# Patient Record
Sex: Female | Born: 1935 | Race: White | Hispanic: No | Marital: Married
Health system: Southern US, Community
[De-identification: ages and names within clinical notes are randomized; demographics above are authoritative.]

## PROBLEM LIST (undated history)

## (undated) DIAGNOSIS — E079 Disorder of thyroid, unspecified: Secondary | ICD-10-CM

## (undated) DIAGNOSIS — I639 Cerebral infarction, unspecified: Secondary | ICD-10-CM

## (undated) DIAGNOSIS — C9110 Chronic lymphocytic leukemia of B-cell type not having achieved remission: Secondary | ICD-10-CM

## (undated) DIAGNOSIS — F329 Major depressive disorder, single episode, unspecified: Secondary | ICD-10-CM

## (undated) DIAGNOSIS — M1990 Unspecified osteoarthritis, unspecified site: Secondary | ICD-10-CM

## (undated) DIAGNOSIS — F32A Depression, unspecified: Secondary | ICD-10-CM

## (undated) DIAGNOSIS — I493 Ventricular premature depolarization: Secondary | ICD-10-CM

## (undated) DIAGNOSIS — C9590 Leukemia, unspecified not having achieved remission: Secondary | ICD-10-CM

## (undated) HISTORY — PX: HERNIA REPAIR: SHX51

## (undated) HISTORY — PX: APPENDECTOMY: SHX54

## (undated) HISTORY — DX: Chronic lymphocytic leukemia of B-cell type not having achieved remission: C91.10

## (undated) HISTORY — PX: ABDOMINAL HYSTERECTOMY: SHX81

## (undated) HISTORY — DX: Unspecified osteoarthritis, unspecified site: M19.90

## (undated) HISTORY — DX: Ventricular premature depolarization: I49.3

---

## 1997-11-13 ENCOUNTER — Ambulatory Visit (HOSPITAL_COMMUNITY): Admission: RE | Admit: 1997-11-13 | Discharge: 1997-11-13 | Payer: Self-pay | Admitting: Orthopaedic Surgery

## 1997-11-24 ENCOUNTER — Ambulatory Visit (HOSPITAL_COMMUNITY): Admission: RE | Admit: 1997-11-24 | Discharge: 1997-11-24 | Payer: Self-pay | Admitting: Orthopaedic Surgery

## 1997-12-29 ENCOUNTER — Inpatient Hospital Stay (HOSPITAL_COMMUNITY): Admission: EM | Admit: 1997-12-29 | Discharge: 1998-01-07 | Payer: Self-pay | Admitting: Emergency Medicine

## 1998-04-20 ENCOUNTER — Ambulatory Visit (HOSPITAL_COMMUNITY): Admission: RE | Admit: 1998-04-20 | Discharge: 1998-04-20 | Payer: Self-pay | Admitting: Gastroenterology

## 1998-06-17 ENCOUNTER — Other Ambulatory Visit: Admission: RE | Admit: 1998-06-17 | Discharge: 1998-06-17 | Payer: Self-pay | Admitting: Internal Medicine

## 1998-06-22 ENCOUNTER — Ambulatory Visit (HOSPITAL_COMMUNITY): Admission: RE | Admit: 1998-06-22 | Discharge: 1998-06-22 | Payer: Self-pay | Admitting: Internal Medicine

## 1998-06-22 ENCOUNTER — Encounter (HOSPITAL_BASED_OUTPATIENT_CLINIC_OR_DEPARTMENT_OTHER): Payer: Self-pay | Admitting: Internal Medicine

## 1999-10-19 ENCOUNTER — Inpatient Hospital Stay (HOSPITAL_COMMUNITY): Admission: RE | Admit: 1999-10-19 | Discharge: 1999-10-23 | Payer: Self-pay | Admitting: Orthopaedic Surgery

## 1999-10-23 ENCOUNTER — Inpatient Hospital Stay (HOSPITAL_COMMUNITY)
Admission: RE | Admit: 1999-10-23 | Discharge: 1999-10-27 | Payer: Self-pay | Admitting: Physical Medicine & Rehabilitation

## 2000-05-16 ENCOUNTER — Encounter: Payer: Self-pay | Admitting: Emergency Medicine

## 2000-05-16 ENCOUNTER — Emergency Department (HOSPITAL_COMMUNITY): Admission: EM | Admit: 2000-05-16 | Discharge: 2000-05-16 | Payer: Self-pay | Admitting: Emergency Medicine

## 2000-07-27 ENCOUNTER — Emergency Department (HOSPITAL_COMMUNITY): Admission: EM | Admit: 2000-07-27 | Discharge: 2000-07-27 | Payer: Self-pay | Admitting: Emergency Medicine

## 2000-11-05 ENCOUNTER — Encounter: Payer: Self-pay | Admitting: Emergency Medicine

## 2000-11-05 ENCOUNTER — Emergency Department (HOSPITAL_COMMUNITY): Admission: EM | Admit: 2000-11-05 | Discharge: 2000-11-05 | Payer: Self-pay | Admitting: Emergency Medicine

## 2003-11-24 ENCOUNTER — Other Ambulatory Visit: Admission: RE | Admit: 2003-11-24 | Discharge: 2003-11-24 | Payer: Self-pay | Admitting: Internal Medicine

## 2004-01-01 ENCOUNTER — Ambulatory Visit (HOSPITAL_COMMUNITY): Admission: RE | Admit: 2004-01-01 | Discharge: 2004-01-01 | Payer: Self-pay | Admitting: Oncology

## 2004-04-21 ENCOUNTER — Ambulatory Visit (HOSPITAL_COMMUNITY): Admission: RE | Admit: 2004-04-21 | Discharge: 2004-04-21 | Payer: Self-pay | Admitting: Gastroenterology

## 2004-04-21 ENCOUNTER — Encounter (INDEPENDENT_AMBULATORY_CARE_PROVIDER_SITE_OTHER): Payer: Self-pay | Admitting: Specialist

## 2004-09-20 ENCOUNTER — Emergency Department (HOSPITAL_COMMUNITY): Admission: EM | Admit: 2004-09-20 | Discharge: 2004-09-20 | Payer: Self-pay | Admitting: *Deleted

## 2005-01-18 ENCOUNTER — Ambulatory Visit: Payer: Self-pay | Admitting: Oncology

## 2005-02-03 ENCOUNTER — Ambulatory Visit (HOSPITAL_COMMUNITY): Admission: RE | Admit: 2005-02-03 | Discharge: 2005-02-03 | Payer: Self-pay | Admitting: Oncology

## 2005-07-13 ENCOUNTER — Ambulatory Visit: Payer: Self-pay | Admitting: Oncology

## 2006-01-11 ENCOUNTER — Ambulatory Visit: Payer: Self-pay | Admitting: Oncology

## 2006-01-12 LAB — CBC WITH DIFFERENTIAL (CANCER CENTER ONLY)
BASO%: 2.9 % — ABNORMAL HIGH (ref 0.0–2.0)
EOS%: 1 % (ref 0.0–7.0)
HCT: 38.5 % (ref 34.8–46.6)
LYMPH#: 22.3 10*3/uL — ABNORMAL HIGH (ref 0.9–3.3)
MCHC: 32.7 g/dL (ref 32.0–36.0)
MONO%: 3.7 % (ref 0.0–13.0)
NEUT%: 13.7 % — ABNORMAL LOW (ref 39.6–80.0)
RDW: 13.1 % (ref 10.5–14.6)

## 2006-01-12 LAB — MORPHOLOGY - CHCC SATELLITE
PLT EST ~~LOC~~: ADEQUATE
Platelet Morphology: NORMAL

## 2006-01-13 LAB — COMPREHENSIVE METABOLIC PANEL
ALT: 11 U/L (ref 0–40)
AST: 19 U/L (ref 0–37)
Calcium: 9.3 mg/dL (ref 8.4–10.5)
Chloride: 102 mEq/L (ref 96–112)
Creatinine, Ser: 0.96 mg/dL (ref 0.40–1.20)
Potassium: 5.5 mEq/L — ABNORMAL HIGH (ref 3.5–5.3)

## 2006-01-13 LAB — DIRECT ANTIGLOBULIN TEST (NOT AT ARMC): DAT IgG: NEGATIVE

## 2006-01-13 LAB — IGG, IGA, IGM: IgM, Serum: 40 mg/dL — ABNORMAL LOW (ref 60–263)

## 2006-01-19 LAB — BASIC METABOLIC PANEL
BUN: 28 mg/dL — ABNORMAL HIGH (ref 6–23)
Chloride: 103 mEq/L (ref 96–112)
Potassium: 4.8 mEq/L (ref 3.5–5.3)
Sodium: 139 mEq/L (ref 135–145)

## 2006-06-30 ENCOUNTER — Ambulatory Visit: Payer: Self-pay | Admitting: Oncology

## 2006-07-04 LAB — CBC WITH DIFFERENTIAL (CANCER CENTER ONLY)
BASO%: 2.6 % — ABNORMAL HIGH (ref 0.0–2.0)
EOS%: 0.5 % (ref 0.0–7.0)
HCT: 40.4 % (ref 34.8–46.6)
LYMPH%: 76.9 % — ABNORMAL HIGH (ref 14.0–48.0)
MCHC: 33.1 g/dL (ref 32.0–36.0)
MCV: 94 fL (ref 81–101)
MONO#: 1.6 10*3/uL — ABNORMAL HIGH (ref 0.1–0.9)
NEUT%: 15 % — ABNORMAL LOW (ref 39.6–80.0)
RDW: 12.1 % (ref 10.5–14.6)

## 2006-07-04 LAB — MORPHOLOGY - CHCC SATELLITE: PLT EST ~~LOC~~: ADEQUATE

## 2006-07-05 LAB — COMPREHENSIVE METABOLIC PANEL
BUN: 29 mg/dL — ABNORMAL HIGH (ref 6–23)
CO2: 29 mEq/L (ref 19–32)
Calcium: 9.1 mg/dL (ref 8.4–10.5)
Chloride: 100 mEq/L (ref 96–112)
Creatinine, Ser: 0.88 mg/dL (ref 0.40–1.20)
Glucose, Bld: 100 mg/dL — ABNORMAL HIGH (ref 70–99)
Potassium: 4.3 mEq/L (ref 3.5–5.3)
Sodium: 139 mEq/L (ref 135–145)

## 2006-07-05 LAB — IGG, IGA, IGM
IgA: 87 mg/dL (ref 68–378)
IgG (Immunoglobin G), Serum: 876 mg/dL (ref 694–1618)
IgM, Serum: 38 mg/dL — ABNORMAL LOW (ref 60–263)

## 2006-07-05 LAB — LACTATE DEHYDROGENASE: LDH: 153 U/L (ref 94–250)

## 2006-07-06 ENCOUNTER — Ambulatory Visit (HOSPITAL_COMMUNITY): Admission: RE | Admit: 2006-07-06 | Discharge: 2006-07-06 | Payer: Self-pay | Admitting: Oncology

## 2006-10-05 ENCOUNTER — Observation Stay (HOSPITAL_COMMUNITY): Admission: EM | Admit: 2006-10-05 | Discharge: 2006-10-06 | Payer: Self-pay | Admitting: Emergency Medicine

## 2006-10-06 ENCOUNTER — Encounter (INDEPENDENT_AMBULATORY_CARE_PROVIDER_SITE_OTHER): Payer: Self-pay | Admitting: *Deleted

## 2007-01-08 ENCOUNTER — Ambulatory Visit: Payer: Self-pay | Admitting: Oncology

## 2007-01-09 LAB — CBC WITH DIFFERENTIAL (CANCER CENTER ONLY)
BASO#: 1.1 10*3/uL — ABNORMAL HIGH (ref 0.0–0.2)
EOS%: 0.7 % (ref 0.0–7.0)
HCT: 37.8 % (ref 34.8–46.6)
HGB: 12.9 g/dL (ref 11.6–15.9)
MCH: 31.4 pg (ref 26.0–34.0)
MCHC: 34.1 g/dL (ref 32.0–36.0)
MCV: 92 fL (ref 81–101)
MONO%: 4.7 % (ref 0.0–13.0)
NEUT%: 11.9 % — ABNORMAL LOW (ref 39.6–80.0)

## 2007-01-10 LAB — COMPREHENSIVE METABOLIC PANEL
Albumin: 4.8 g/dL (ref 3.5–5.2)
Alkaline Phosphatase: 40 U/L (ref 39–117)
BUN: 26 mg/dL — ABNORMAL HIGH (ref 6–23)
CO2: 24 mEq/L (ref 19–32)
Calcium: 9.4 mg/dL (ref 8.4–10.5)
Glucose, Bld: 90 mg/dL (ref 70–99)
Potassium: 4.3 mEq/L (ref 3.5–5.3)

## 2007-01-10 LAB — IGG, IGA, IGM
IgA: 67 mg/dL — ABNORMAL LOW (ref 68–378)
IgG (Immunoglobin G), Serum: 640 mg/dL — ABNORMAL LOW (ref 694–1618)

## 2007-01-10 LAB — DIRECT ANTIGLOBULIN TEST (NOT AT ARMC): DAT IgG: NEGATIVE

## 2007-01-10 LAB — LACTATE DEHYDROGENASE: LDH: 153 U/L (ref 94–250)

## 2007-06-17 ENCOUNTER — Emergency Department (HOSPITAL_COMMUNITY): Admission: EM | Admit: 2007-06-17 | Discharge: 2007-06-18 | Payer: Self-pay | Admitting: Emergency Medicine

## 2007-08-16 ENCOUNTER — Ambulatory Visit: Payer: Self-pay | Admitting: Oncology

## 2007-08-17 LAB — MANUAL DIFFERENTIAL (CHCC SATELLITE)
ALC: 30.1 10*3/uL — ABNORMAL HIGH (ref 0.6–2.2)
LYMPH: 71 % — ABNORMAL HIGH (ref 14–48)
MONO: 2 % (ref 0–13)
RBC Comments: NORMAL
SEG: 25 % — ABNORMAL LOW (ref 40–75)
Variant Lymph: 2 % — ABNORMAL HIGH (ref 0–0)

## 2007-08-17 LAB — CBC WITH DIFFERENTIAL (CANCER CENTER ONLY)
MCH: 30.6 pg (ref 26.0–34.0)
MCHC: 33.1 g/dL (ref 32.0–36.0)
MCV: 93 fL (ref 81–101)
Platelets: 204 10*3/uL (ref 145–400)

## 2007-08-18 LAB — IGG, IGA, IGM
IgG (Immunoglobin G), Serum: 695 mg/dL (ref 694–1618)
IgM, Serum: 27 mg/dL — ABNORMAL LOW (ref 60–263)

## 2007-08-18 LAB — COMPREHENSIVE METABOLIC PANEL
BUN: 32 mg/dL — ABNORMAL HIGH (ref 6–23)
CO2: 28 mEq/L (ref 19–32)
Calcium: 9.5 mg/dL (ref 8.4–10.5)
Chloride: 102 mEq/L (ref 96–112)
Creatinine, Ser: 0.88 mg/dL (ref 0.40–1.20)
Glucose, Bld: 97 mg/dL (ref 70–99)
Total Bilirubin: 0.7 mg/dL (ref 0.3–1.2)

## 2007-08-18 LAB — LACTATE DEHYDROGENASE: LDH: 137 U/L (ref 94–250)

## 2007-08-18 LAB — DIRECT ANTIGLOBULIN TEST (NOT AT ARMC): DAT IgG: NEGATIVE

## 2007-08-18 LAB — HAPTOGLOBIN: Haptoglobin: 131 mg/dL (ref 16–200)

## 2008-01-09 ENCOUNTER — Ambulatory Visit (HOSPITAL_COMMUNITY): Admission: RE | Admit: 2008-01-09 | Discharge: 2008-01-09 | Payer: Self-pay | Admitting: Internal Medicine

## 2008-01-10 ENCOUNTER — Ambulatory Visit: Payer: Self-pay | Admitting: Oncology

## 2008-01-14 LAB — CBC WITH DIFFERENTIAL (CANCER CENTER ONLY)
BASO#: 2.2 10*3/uL — ABNORMAL HIGH (ref 0.0–0.2)
Eosinophils Absolute: 0.3 10*3/uL (ref 0.0–0.5)
HCT: 35.4 % (ref 34.8–46.6)
LYMPH%: 81.1 % — ABNORMAL HIGH (ref 14.0–48.0)
MCV: 90 fL (ref 81–101)
MONO#: 2.6 10*3/uL — ABNORMAL HIGH (ref 0.1–0.9)
NEUT%: 8.9 % — ABNORMAL LOW (ref 39.6–80.0)
RBC: 3.93 10*6/uL (ref 3.70–5.32)
WBC: 43.6 10*3/uL — ABNORMAL HIGH (ref 3.9–10.0)

## 2008-01-15 LAB — DIRECT ANTIGLOBULIN TEST (NOT AT ARMC): DAT IgG: NEGATIVE

## 2008-01-15 LAB — COMPREHENSIVE METABOLIC PANEL
ALT: 9 U/L (ref 0–35)
Alkaline Phosphatase: 36 U/L — ABNORMAL LOW (ref 39–117)
Glucose, Bld: 84 mg/dL (ref 70–99)
Sodium: 142 mEq/L (ref 135–145)
Total Bilirubin: 0.6 mg/dL (ref 0.3–1.2)
Total Protein: 6.6 g/dL (ref 6.0–8.3)

## 2008-01-15 LAB — HAPTOGLOBIN: Haptoglobin: 147 mg/dL (ref 16–200)

## 2008-01-15 LAB — IGG, IGA, IGM: IgG (Immunoglobin G), Serum: 629 mg/dL — ABNORMAL LOW (ref 694–1618)

## 2008-07-11 ENCOUNTER — Ambulatory Visit: Payer: Self-pay | Admitting: Oncology

## 2008-07-15 LAB — CMP (CANCER CENTER ONLY)
AST: 34 U/L (ref 11–38)
Albumin: 4.2 g/dL (ref 3.3–5.5)
Alkaline Phosphatase: 34 U/L (ref 26–84)
Chloride: 104 mEq/L (ref 98–108)
Potassium: 4.3 mEq/L (ref 3.3–4.7)
Sodium: 139 mEq/L (ref 128–145)
Total Protein: 6.9 g/dL (ref 6.4–8.1)

## 2008-07-15 LAB — MANUAL DIFFERENTIAL (CHCC SATELLITE)
BASO: 1 % (ref 0–2)
Eos: 1 % (ref 0–7)
MONO: 2 % (ref 0–13)
RBC Comments: NORMAL
Variant Lymph: 2 % — ABNORMAL HIGH (ref 0–0)

## 2008-07-15 LAB — CBC WITH DIFFERENTIAL (CANCER CENTER ONLY)
HGB: 12.6 g/dL (ref 11.6–15.9)
MCH: 30.6 pg (ref 26.0–34.0)
RBC: 4.13 10*6/uL (ref 3.70–5.32)

## 2008-07-16 LAB — DIRECT ANTIGLOBULIN TEST (NOT AT ARMC): DAT (Complement): NEGATIVE

## 2008-07-16 LAB — IGG, IGA, IGM
IgA: 61 mg/dL — ABNORMAL LOW (ref 68–378)
IgG (Immunoglobin G), Serum: 630 mg/dL — ABNORMAL LOW (ref 694–1618)
IgM, Serum: 28 mg/dL — ABNORMAL LOW (ref 60–263)

## 2008-08-21 ENCOUNTER — Other Ambulatory Visit: Admission: RE | Admit: 2008-08-21 | Discharge: 2008-08-21 | Payer: Self-pay | Admitting: Oncology

## 2008-08-21 ENCOUNTER — Encounter: Payer: Self-pay | Admitting: Oncology

## 2008-08-21 LAB — MANUAL DIFFERENTIAL (CHCC SATELLITE)
MONO: 2 % (ref 0–13)
PLT EST ~~LOC~~: ADEQUATE
Platelet Morphology: NORMAL

## 2008-08-21 LAB — CBC WITH DIFFERENTIAL (CANCER CENTER ONLY)
HCT: 37.8 % (ref 34.8–46.6)
Platelets: 158 10*3/uL (ref 145–400)
RDW: 12 % (ref 10.5–14.6)
WBC: 57.7 10*3/uL (ref 3.9–10.0)

## 2008-08-25 ENCOUNTER — Ambulatory Visit (HOSPITAL_COMMUNITY): Admission: RE | Admit: 2008-08-25 | Discharge: 2008-08-25 | Payer: Self-pay | Admitting: Oncology

## 2008-09-02 LAB — ZAP-70 - CHCC SATELLITE

## 2008-09-03 ENCOUNTER — Ambulatory Visit: Payer: Self-pay | Admitting: Oncology

## 2008-09-04 LAB — MANUAL DIFFERENTIAL (CHCC SATELLITE)
ALC: 44.5 10*3/uL — ABNORMAL HIGH (ref 0.6–2.2)
Eos: 1 % (ref 0–7)
MONO: 2 % (ref 0–13)
PLT EST ~~LOC~~: ADEQUATE
RBC Comments: NORMAL

## 2008-09-04 LAB — CBC WITH DIFFERENTIAL (CANCER CENTER ONLY)
MCH: 30.4 pg (ref 26.0–34.0)
Platelets: 173 10*3/uL (ref 145–400)
RBC: 4.24 10*6/uL (ref 3.70–5.32)

## 2008-09-16 ENCOUNTER — Ambulatory Visit (HOSPITAL_COMMUNITY): Admission: RE | Admit: 2008-09-16 | Discharge: 2008-09-16 | Payer: Self-pay | Admitting: Oncology

## 2008-10-16 ENCOUNTER — Ambulatory Visit: Payer: Self-pay | Admitting: Oncology

## 2008-10-20 LAB — CMP (CANCER CENTER ONLY)
ALT(SGPT): 17 U/L (ref 10–47)
Alkaline Phosphatase: 37 U/L (ref 26–84)
Creat: 1 mg/dl (ref 0.6–1.2)
Sodium: 140 mEq/L (ref 128–145)
Total Bilirubin: 0.8 mg/dl (ref 0.20–1.60)
Total Protein: 7.2 g/dL (ref 6.4–8.1)

## 2008-10-20 LAB — MANUAL DIFFERENTIAL (CHCC SATELLITE)
ALC: 46.1 10*3/uL — ABNORMAL HIGH (ref 0.6–2.2)
ANC (CHCC HP manual diff): 4.6 10*3/uL (ref 1.5–6.7)
LYMPH: 90 % — ABNORMAL HIGH (ref 14–48)
MONO: 1 % (ref 0–13)
PLT EST ~~LOC~~: ADEQUATE

## 2008-10-20 LAB — CBC WITH DIFFERENTIAL (CANCER CENTER ONLY)
HCT: 37.4 % (ref 34.8–46.6)
MCHC: 34.2 g/dL (ref 32.0–36.0)
Platelets: 151 10*3/uL (ref 145–400)
RDW: 12.7 % (ref 10.5–14.6)

## 2008-10-21 LAB — IGG, IGA, IGM
IgA: 60 mg/dL — ABNORMAL LOW (ref 68–378)
IgG (Immunoglobin G), Serum: 576 mg/dL — ABNORMAL LOW (ref 694–1618)

## 2008-10-21 LAB — DIRECT ANTIGLOBULIN TEST (NOT AT ARMC): DAT (Complement): NEGATIVE

## 2008-10-21 LAB — HAPTOGLOBIN: Haptoglobin: 138 mg/dL (ref 16–200)

## 2008-10-21 LAB — URIC ACID: Uric Acid, Serum: 4.4 mg/dL (ref 2.4–7.0)

## 2008-11-04 LAB — CMP (CANCER CENTER ONLY)
AST: 23 U/L (ref 11–38)
Alkaline Phosphatase: 45 U/L (ref 26–84)
BUN, Bld: 17 mg/dL (ref 7–22)
Creat: 0.8 mg/dl (ref 0.6–1.2)
Glucose, Bld: 93 mg/dL (ref 73–118)
Total Bilirubin: 0.7 mg/dl (ref 0.20–1.60)

## 2008-11-04 LAB — CBC WITH DIFFERENTIAL (CANCER CENTER ONLY)
BASO#: 0 10*3/uL (ref 0.0–0.2)
Eosinophils Absolute: 0.2 10*3/uL (ref 0.0–0.5)
HGB: 10.9 g/dL — ABNORMAL LOW (ref 11.6–15.9)
MCH: 31.1 pg (ref 26.0–34.0)
MCV: 91 fL (ref 81–101)
MONO#: 0.3 10*3/uL (ref 0.1–0.9)
NEUT#: 1.3 10*3/uL — ABNORMAL LOW (ref 1.5–6.5)
Platelets: 146 10*3/uL (ref 145–400)
RBC: 3.49 10*6/uL — ABNORMAL LOW (ref 3.70–5.32)
WBC: 2.3 10*3/uL — ABNORMAL LOW (ref 3.9–10.0)

## 2008-11-17 LAB — CBC WITH DIFFERENTIAL (CANCER CENTER ONLY)
BASO%: 0.6 % (ref 0.0–2.0)
EOS%: 3.4 % (ref 0.0–7.0)
HCT: 34.2 % — ABNORMAL LOW (ref 34.8–46.6)
LYMPH%: 26.9 % (ref 14.0–48.0)
MCH: 32 pg (ref 26.0–34.0)
MCHC: 34.7 g/dL (ref 32.0–36.0)
MCV: 92 fL (ref 81–101)
MONO#: 0.4 10*3/uL (ref 0.1–0.9)
MONO%: 14.8 % — ABNORMAL HIGH (ref 0.0–13.0)
NEUT%: 54.3 % (ref 39.6–80.0)
RDW: 13.7 % (ref 10.5–14.6)

## 2008-11-17 LAB — CMP (CANCER CENTER ONLY)
AST: 24 U/L (ref 11–38)
Albumin: 3.6 g/dL (ref 3.3–5.5)
BUN, Bld: 15 mg/dL (ref 7–22)
CO2: 30 mEq/L (ref 18–33)
Calcium: 9.4 mg/dL (ref 8.0–10.3)
Chloride: 99 mEq/L (ref 98–108)
Creat: 0.9 mg/dl (ref 0.6–1.2)
Potassium: 4.6 mEq/L (ref 3.3–4.7)

## 2008-11-17 LAB — IGG, IGA, IGM
IgG (Immunoglobin G), Serum: 578 mg/dL — ABNORMAL LOW (ref 694–1618)
IgM, Serum: 22 mg/dL — ABNORMAL LOW (ref 60–263)

## 2008-11-25 LAB — CMP (CANCER CENTER ONLY)
ALT(SGPT): 9 U/L — ABNORMAL LOW (ref 10–47)
AST: 20 U/L (ref 11–38)
Albumin: 3.6 g/dL (ref 3.3–5.5)
CO2: 30 mEq/L (ref 18–33)
Calcium: 9.4 mg/dL (ref 8.0–10.3)
Chloride: 103 mEq/L (ref 98–108)
Potassium: 4 mEq/L (ref 3.3–4.7)
Sodium: 141 mEq/L (ref 128–145)
Total Protein: 6.7 g/dL (ref 6.4–8.1)

## 2008-11-25 LAB — CBC WITH DIFFERENTIAL (CANCER CENTER ONLY)
BASO#: 0 10*3/uL (ref 0.0–0.2)
Eosinophils Absolute: 0.1 10*3/uL (ref 0.0–0.5)
HCT: 35.2 % (ref 34.8–46.6)
LYMPH%: 19.2 % (ref 14.0–48.0)
MCH: 32.5 pg (ref 26.0–34.0)
MCV: 94 fL (ref 81–101)
MONO#: 0.5 10*3/uL (ref 0.1–0.9)
MONO%: 11.8 % (ref 0.0–13.0)
NEUT%: 65.3 % (ref 39.6–80.0)
RBC: 3.76 10*6/uL (ref 3.70–5.32)
WBC: 3.9 10*3/uL (ref 3.9–10.0)

## 2008-11-29 ENCOUNTER — Ambulatory Visit: Payer: Self-pay | Admitting: Hematology

## 2008-12-02 ENCOUNTER — Ambulatory Visit: Payer: Self-pay | Admitting: Oncology

## 2008-12-04 LAB — CBC WITH DIFFERENTIAL (CANCER CENTER ONLY)
HCT: 34.7 % — ABNORMAL LOW (ref 34.8–46.6)
HGB: 12 g/dL (ref 11.6–15.9)
MCH: 32.9 pg (ref 26.0–34.0)
RBC: 3.64 10*6/uL — ABNORMAL LOW (ref 3.70–5.32)

## 2008-12-04 LAB — BASIC METABOLIC PANEL - CANCER CENTER ONLY
CO2: 27 mEq/L (ref 18–33)
Calcium: 9 mg/dL (ref 8.0–10.3)
Chloride: 101 mEq/L (ref 98–108)
Creat: 0.7 mg/dl (ref 0.6–1.2)
Glucose, Bld: 108 mg/dL (ref 73–118)
Sodium: 142 mEq/L (ref 128–145)

## 2008-12-04 LAB — MANUAL DIFFERENTIAL (CHCC SATELLITE)
Eos: 1 % (ref 0–7)
LYMPH: 4 % — ABNORMAL LOW (ref 14–48)
MONO: 8 % (ref 0–13)
SEG: 71 % (ref 40–75)

## 2008-12-04 LAB — URIC ACID: Uric Acid, Serum: 2.9 mg/dL (ref 2.4–7.0)

## 2008-12-22 LAB — COMPREHENSIVE METABOLIC PANEL
ALT: 14 U/L (ref 0–35)
AST: 20 U/L (ref 0–37)
Albumin: 4.2 g/dL (ref 3.5–5.2)
Alkaline Phosphatase: 49 U/L (ref 39–117)
Glucose, Bld: 97 mg/dL (ref 70–99)
Potassium: 4.2 mEq/L (ref 3.5–5.3)
Sodium: 140 mEq/L (ref 135–145)
Total Bilirubin: 0.7 mg/dL (ref 0.3–1.2)
Total Protein: 6.3 g/dL (ref 6.0–8.3)

## 2008-12-22 LAB — URIC ACID: Uric Acid, Serum: 2.6 mg/dL (ref 2.4–7.0)

## 2008-12-22 LAB — CBC WITH DIFFERENTIAL (CANCER CENTER ONLY)
BASO#: 0 10*3/uL (ref 0.0–0.2)
Eosinophils Absolute: 0.2 10*3/uL (ref 0.0–0.5)
HGB: 12.7 g/dL (ref 11.6–15.9)
LYMPH#: 0.5 10*3/uL — ABNORMAL LOW (ref 0.9–3.3)
MCH: 32.7 pg (ref 26.0–34.0)
MONO%: 11.2 % (ref 0.0–13.0)
NEUT#: 3.4 10*3/uL (ref 1.5–6.5)
Platelets: 247 10*3/uL (ref 145–400)
RBC: 3.88 10*6/uL (ref 3.70–5.32)
WBC: 4.6 10*3/uL (ref 3.9–10.0)

## 2009-01-06 LAB — CMP (CANCER CENTER ONLY)
Albumin: 3.5 g/dL (ref 3.3–5.5)
BUN, Bld: 22 mg/dL (ref 7–22)
CO2: 30 mEq/L (ref 18–33)
Calcium: 10.2 mg/dL (ref 8.0–10.3)
Chloride: 108 mEq/L (ref 98–108)
Creat: 1 mg/dl (ref 0.6–1.2)

## 2009-01-06 LAB — MANUAL DIFFERENTIAL (CHCC SATELLITE)
ANC (CHCC HP manual diff): 7.8 10*3/uL — ABNORMAL HIGH (ref 1.5–6.7)
Band Neutrophils: 9 % (ref 0–10)
PLT EST ~~LOC~~: ADEQUATE
SEG: 84 % — ABNORMAL HIGH (ref 40–75)

## 2009-01-06 LAB — CBC WITH DIFFERENTIAL (CANCER CENTER ONLY)
HCT: 32.3 % — ABNORMAL LOW (ref 34.8–46.6)
HGB: 11.3 g/dL — ABNORMAL LOW (ref 11.6–15.9)
RBC: 3.3 10*6/uL — ABNORMAL LOW (ref 3.70–5.32)
RDW: 14.6 % (ref 10.5–14.6)
WBC: 8.4 10*3/uL (ref 3.9–10.0)

## 2009-01-15 ENCOUNTER — Ambulatory Visit: Payer: Self-pay | Admitting: Oncology

## 2009-01-19 LAB — CMP (CANCER CENTER ONLY)
ALT(SGPT): 17 U/L (ref 10–47)
AST: 21 U/L (ref 11–38)
CO2: 27 mEq/L (ref 18–33)
Chloride: 100 mEq/L (ref 98–108)
Sodium: 140 mEq/L (ref 128–145)
Total Bilirubin: 0.7 mg/dl (ref 0.20–1.60)
Total Protein: 6.3 g/dL — ABNORMAL LOW (ref 6.4–8.1)

## 2009-01-19 LAB — MANUAL DIFFERENTIAL (CHCC SATELLITE)
ANC (CHCC HP manual diff): 1.5 10*3/uL (ref 1.5–6.7)
Band Neutrophils: 2 % (ref 0–10)
Eos: 4 % (ref 0–7)
LYMPH: 14 % (ref 14–48)

## 2009-01-19 LAB — CBC WITH DIFFERENTIAL (CANCER CENTER ONLY)
HCT: 34.5 % — ABNORMAL LOW (ref 34.8–46.6)
MCH: 33.8 pg (ref 26.0–34.0)
MCV: 97 fL (ref 81–101)
RDW: 14.6 % (ref 10.5–14.6)
WBC: 2.3 10*3/uL — ABNORMAL LOW (ref 3.9–10.0)

## 2009-01-20 LAB — HAPTOGLOBIN: Haptoglobin: 101 mg/dL (ref 16–200)

## 2009-01-20 LAB — BETA 2 MICROGLOBULIN, SERUM: Beta-2 Microglobulin: 1.97 mg/L — ABNORMAL HIGH (ref 1.01–1.73)

## 2009-01-20 LAB — URIC ACID: Uric Acid, Serum: 2 mg/dL — ABNORMAL LOW (ref 2.4–7.0)

## 2009-01-20 LAB — LACTATE DEHYDROGENASE: LDH: 124 U/L (ref 94–250)

## 2009-02-03 LAB — CMP (CANCER CENTER ONLY)
ALT(SGPT): 17 U/L (ref 10–47)
CO2: 28 mEq/L (ref 18–33)
Calcium: 9.2 mg/dL (ref 8.0–10.3)
Chloride: 98 mEq/L (ref 98–108)
Creat: 0.7 mg/dl (ref 0.6–1.2)
Glucose, Bld: 94 mg/dL (ref 73–118)
Sodium: 139 mEq/L (ref 128–145)
Total Protein: 6.1 g/dL — ABNORMAL LOW (ref 6.4–8.1)

## 2009-02-03 LAB — CBC WITH DIFFERENTIAL (CANCER CENTER ONLY)
BASO#: 0 10*3/uL (ref 0.0–0.2)
BASO%: 0.4 % (ref 0.0–2.0)
EOS%: 2.1 % (ref 0.0–7.0)
HCT: 31.7 % — ABNORMAL LOW (ref 34.8–46.6)
HGB: 11.1 g/dL — ABNORMAL LOW (ref 11.6–15.9)
LYMPH#: 1.4 10*3/uL (ref 0.9–3.3)
MCH: 35.1 pg — ABNORMAL HIGH (ref 26.0–34.0)
MCHC: 34.9 g/dL (ref 32.0–36.0)
MONO%: 23.7 % — ABNORMAL HIGH (ref 0.0–13.0)
NEUT#: 4.3 10*3/uL (ref 1.5–6.5)
NEUT%: 55.7 % (ref 39.6–80.0)
RDW: 14.3 % (ref 10.5–14.6)

## 2009-02-11 ENCOUNTER — Ambulatory Visit: Payer: Self-pay | Admitting: Oncology

## 2009-02-16 LAB — CBC WITH DIFFERENTIAL (CANCER CENTER ONLY)
BASO%: 0.9 % (ref 0.0–2.0)
EOS%: 3.2 % (ref 0.0–7.0)
LYMPH#: 0.2 10*3/uL — ABNORMAL LOW (ref 0.9–3.3)
MCH: 34.7 pg — ABNORMAL HIGH (ref 26.0–34.0)
MCHC: 34.8 g/dL (ref 32.0–36.0)
MONO%: 16.3 % — ABNORMAL HIGH (ref 0.0–13.0)
NEUT#: 1.7 10*3/uL (ref 1.5–6.5)
Platelets: 138 10*3/uL — ABNORMAL LOW (ref 145–400)

## 2009-02-16 LAB — CMP (CANCER CENTER ONLY)
ALT(SGPT): 24 U/L (ref 10–47)
Alkaline Phosphatase: 59 U/L (ref 26–84)
Potassium: 4.5 mEq/L (ref 3.3–4.7)
Sodium: 140 mEq/L (ref 128–145)
Total Bilirubin: 0.6 mg/dl (ref 0.20–1.60)
Total Protein: 6.2 g/dL — ABNORMAL LOW (ref 6.4–8.1)

## 2009-02-19 ENCOUNTER — Ambulatory Visit (HOSPITAL_COMMUNITY): Admission: RE | Admit: 2009-02-19 | Discharge: 2009-02-19 | Payer: Self-pay | Admitting: Oncology

## 2009-02-20 ENCOUNTER — Ambulatory Visit: Payer: Self-pay | Admitting: Hematology

## 2009-02-26 LAB — MANUAL DIFFERENTIAL (CHCC SATELLITE)

## 2009-02-26 LAB — CBC WITH DIFFERENTIAL (CANCER CENTER ONLY)
HGB: 11.2 g/dL — ABNORMAL LOW (ref 11.6–15.9)
MCH: 35.5 pg — ABNORMAL HIGH (ref 26.0–34.0)
Platelets: 120 10*3/uL — ABNORMAL LOW (ref 145–400)
RBC: 3.17 10*6/uL — ABNORMAL LOW (ref 3.70–5.32)
WBC: 9 10*3/uL (ref 3.9–10.0)

## 2009-02-26 LAB — BASIC METABOLIC PANEL - CANCER CENTER ONLY
BUN, Bld: 23 mg/dL — ABNORMAL HIGH (ref 7–22)
CO2: 30 mEq/L (ref 18–33)
Calcium: 9.4 mg/dL (ref 8.0–10.3)
Creat: 0.8 mg/dl (ref 0.6–1.2)
Glucose, Bld: 100 mg/dL (ref 73–118)

## 2009-03-11 ENCOUNTER — Ambulatory Visit: Payer: Self-pay | Admitting: Oncology

## 2009-03-16 LAB — CBC WITH DIFFERENTIAL (CANCER CENTER ONLY)
BASO#: 0 10*3/uL (ref 0.0–0.2)
BASO%: 0.3 % (ref 0.0–2.0)
EOS%: 3.4 % (ref 0.0–7.0)
HCT: 34.5 % — ABNORMAL LOW (ref 34.8–46.6)
HGB: 12 g/dL (ref 11.6–15.9)
LYMPH#: 0.5 10*3/uL — ABNORMAL LOW (ref 0.9–3.3)
LYMPH%: 16.1 % (ref 14.0–48.0)
MCHC: 34.8 g/dL (ref 32.0–36.0)
MCV: 101 fL (ref 81–101)
NEUT%: 67.6 % (ref 39.6–80.0)
RDW: 13.7 % (ref 10.5–14.6)

## 2009-03-16 LAB — CMP (CANCER CENTER ONLY)
Alkaline Phosphatase: 60 U/L (ref 26–84)
BUN, Bld: 28 mg/dL — ABNORMAL HIGH (ref 7–22)
CO2: 32 mEq/L (ref 18–33)
Creat: 0.9 mg/dl (ref 0.6–1.2)
Glucose, Bld: 84 mg/dL (ref 73–118)
Sodium: 142 mEq/L (ref 128–145)
Total Bilirubin: 0.7 mg/dl (ref 0.20–1.60)
Total Protein: 6.3 g/dL — ABNORMAL LOW (ref 6.4–8.1)

## 2009-03-23 LAB — CBC WITH DIFFERENTIAL (CANCER CENTER ONLY)
BASO#: 0.1 10e3/uL (ref 0.0–0.2)
BASO%: 0.9 % (ref 0.0–2.0)
EOS%: 1.6 % (ref 0.0–7.0)
Eosinophils Absolute: 0.1 10e3/uL (ref 0.0–0.5)
HCT: 34.2 % — ABNORMAL LOW (ref 34.8–46.6)
HGB: 11.7 g/dL (ref 11.6–15.9)
LYMPH#: 0.1 10e3/uL — ABNORMAL LOW (ref 0.9–3.3)
LYMPH%: 1.4 % — ABNORMAL LOW (ref 14.0–48.0)
MCH: 35.1 pg — ABNORMAL HIGH (ref 26.0–34.0)
MCHC: 34.2 g/dL (ref 32.0–36.0)
MCV: 103 fL — ABNORMAL HIGH (ref 81–101)
MONO#: 1.1 10e3/uL — ABNORMAL HIGH (ref 0.1–0.9)
MONO%: 15.5 % — ABNORMAL HIGH (ref 0.0–13.0)
NEUT#: 5.7 10e3/uL (ref 1.5–6.5)
NEUT%: 80.6 % — ABNORMAL HIGH (ref 39.6–80.0)
Platelets: 110 10e3/uL — ABNORMAL LOW (ref 145–400)
RBC: 3.33 10e6/uL — ABNORMAL LOW (ref 3.70–5.32)
RDW: 13.9 % (ref 10.5–14.6)
WBC: 7.1 10e3/uL (ref 3.9–10.0)

## 2009-03-23 LAB — BASIC METABOLIC PANEL - CANCER CENTER ONLY
Chloride: 102 mEq/L (ref 98–108)
Glucose, Bld: 100 mg/dL (ref 73–118)
Potassium: 4.6 mEq/L (ref 3.3–4.7)
Sodium: 139 mEq/L (ref 128–145)

## 2009-03-23 LAB — TECHNOLOGIST REVIEW CHCC SATELLITE

## 2009-05-01 ENCOUNTER — Ambulatory Visit: Payer: Self-pay | Admitting: Oncology

## 2009-05-04 ENCOUNTER — Emergency Department (HOSPITAL_COMMUNITY): Admission: EM | Admit: 2009-05-04 | Discharge: 2009-05-04 | Payer: Self-pay | Admitting: Emergency Medicine

## 2009-05-07 ENCOUNTER — Encounter: Admission: RE | Admit: 2009-05-07 | Discharge: 2009-05-07 | Payer: Self-pay | Admitting: Surgery

## 2009-06-23 ENCOUNTER — Ambulatory Visit: Payer: Self-pay | Admitting: Oncology

## 2009-06-29 LAB — CMP (CANCER CENTER ONLY)
Albumin: 3.4 g/dL (ref 3.3–5.5)
BUN, Bld: 23 mg/dL — ABNORMAL HIGH (ref 7–22)
CO2: 30 mEq/L (ref 18–33)
Calcium: 10.1 mg/dL (ref 8.0–10.3)
Chloride: 102 mEq/L (ref 98–108)
Glucose, Bld: 82 mg/dL (ref 73–118)
Potassium: 5.1 mEq/L — ABNORMAL HIGH (ref 3.3–4.7)
Sodium: 141 mEq/L (ref 128–145)
Total Protein: 6.8 g/dL (ref 6.4–8.1)

## 2009-06-29 LAB — LACTATE DEHYDROGENASE: LDH: 223 U/L (ref 94–250)

## 2009-06-29 LAB — CBC WITH DIFFERENTIAL (CANCER CENTER ONLY)
BASO%: 0.8 % (ref 0.0–2.0)
Eosinophils Absolute: 0.1 10*3/uL (ref 0.0–0.5)
LYMPH#: 0.3 10*3/uL — ABNORMAL LOW (ref 0.9–3.3)
LYMPH%: 13.2 % — ABNORMAL LOW (ref 14.0–48.0)
MCV: 99 fL (ref 81–101)
MONO#: 0.4 10*3/uL (ref 0.1–0.9)
NEUT#: 1.3 10*3/uL — ABNORMAL LOW (ref 1.5–6.5)
Platelets: 159 10*3/uL (ref 145–400)
RBC: 3.8 10*6/uL (ref 3.70–5.32)
WBC: 2.1 10*3/uL — ABNORMAL LOW (ref 3.9–10.0)

## 2009-06-29 LAB — IGG, IGA, IGM: IgG (Immunoglobin G), Serum: 659 mg/dL — ABNORMAL LOW (ref 694–1618)

## 2009-06-29 LAB — TECHNOLOGIST REVIEW CHCC SATELLITE

## 2009-07-30 ENCOUNTER — Ambulatory Visit: Payer: Self-pay | Admitting: Oncology

## 2009-09-10 ENCOUNTER — Ambulatory Visit: Payer: Self-pay | Admitting: Oncology

## 2009-10-14 ENCOUNTER — Ambulatory Visit: Payer: Self-pay | Admitting: Oncology

## 2009-12-07 ENCOUNTER — Ambulatory Visit: Payer: Self-pay | Admitting: Oncology

## 2009-12-15 LAB — CMP (CANCER CENTER ONLY)
ALT(SGPT): 13 U/L (ref 10–47)
AST: 24 U/L (ref 11–38)
Alkaline Phosphatase: 38 U/L (ref 26–84)
CO2: 30 mEq/L (ref 18–33)
Sodium: 138 mEq/L (ref 128–145)
Total Bilirubin: 0.7 mg/dl (ref 0.20–1.60)
Total Protein: 6.6 g/dL (ref 6.4–8.1)

## 2009-12-15 LAB — CBC WITH DIFFERENTIAL (CANCER CENTER ONLY)
BASO%: 0.5 % (ref 0.0–2.0)
EOS%: 4.9 % (ref 0.0–7.0)
LYMPH%: 17.9 % (ref 14.0–48.0)
MCV: 92 fL (ref 81–101)
MONO#: 0.4 10*3/uL (ref 0.1–0.9)
MONO%: 18.2 % — ABNORMAL HIGH (ref 0.0–13.0)
Platelets: 150 10*3/uL (ref 145–400)
RDW: 12.2 % (ref 10.5–14.6)
WBC: 1.9 10*3/uL — ABNORMAL LOW (ref 3.9–10.0)

## 2009-12-16 LAB — HAPTOGLOBIN: Haptoglobin: 128 mg/dL (ref 16–200)

## 2009-12-16 LAB — IGG, IGA, IGM
IgA: 53 mg/dL — ABNORMAL LOW (ref 68–378)
IgM, Serum: 16 mg/dL — ABNORMAL LOW (ref 60–263)

## 2010-01-05 ENCOUNTER — Ambulatory Visit: Payer: Self-pay | Admitting: Oncology

## 2010-02-16 ENCOUNTER — Ambulatory Visit: Payer: Self-pay | Admitting: Oncology

## 2010-03-16 LAB — CBC WITH DIFFERENTIAL/PLATELET
BASO%: 0.2 % (ref 0.0–2.0)
Basophils Absolute: 0 10*3/uL (ref 0.0–0.1)
HCT: 38.2 % (ref 34.8–46.6)
HGB: 13 g/dL (ref 11.6–15.9)
MONO#: 0.3 10*3/uL (ref 0.1–0.9)
NEUT#: 3.4 10*3/uL (ref 1.5–6.5)
NEUT%: 81.8 % — ABNORMAL HIGH (ref 38.4–76.8)
WBC: 4.1 10*3/uL (ref 3.9–10.3)
lymph#: 0.3 10*3/uL — ABNORMAL LOW (ref 0.9–3.3)

## 2010-03-16 LAB — CMP (CANCER CENTER ONLY)
BUN, Bld: 25 mg/dL — ABNORMAL HIGH (ref 7–22)
CO2: 30 mEq/L (ref 18–33)
Calcium: 9.3 mg/dL (ref 8.0–10.3)
Chloride: 103 mEq/L (ref 98–108)
Creat: 0.9 mg/dl (ref 0.6–1.2)

## 2010-03-17 LAB — HAPTOGLOBIN: Haptoglobin: 195 mg/dL (ref 16–200)

## 2010-03-17 LAB — IGG, IGA, IGM: IgM, Serum: 11 mg/dL — ABNORMAL LOW (ref 60–263)

## 2010-04-05 ENCOUNTER — Emergency Department (HOSPITAL_COMMUNITY): Admission: EM | Admit: 2010-04-05 | Discharge: 2010-04-05 | Payer: Self-pay | Admitting: Emergency Medicine

## 2010-04-21 ENCOUNTER — Ambulatory Visit: Payer: Self-pay | Admitting: Oncology

## 2010-06-03 ENCOUNTER — Ambulatory Visit: Payer: Self-pay | Admitting: Oncology

## 2010-06-16 LAB — CMP (CANCER CENTER ONLY)
ALT(SGPT): 21 U/L (ref 10–47)
Albumin: 4.1 g/dL (ref 3.3–5.5)
Alkaline Phosphatase: 38 U/L (ref 26–84)
CO2: 30 mEq/L (ref 18–33)
Glucose, Bld: 90 mg/dL (ref 73–118)
Potassium: 4.8 mEq/L — ABNORMAL HIGH (ref 3.3–4.7)
Sodium: 141 mEq/L (ref 128–145)
Total Protein: 6.7 g/dL (ref 6.4–8.1)

## 2010-06-16 LAB — CBC WITH DIFFERENTIAL (CANCER CENTER ONLY)
BASO%: 0.5 % (ref 0.0–2.0)
Eosinophils Absolute: 0.1 10*3/uL (ref 0.0–0.5)
MONO#: 0.4 10*3/uL (ref 0.1–0.9)
NEUT#: 1.5 10*3/uL (ref 1.5–6.5)
Platelets: 144 10*3/uL — ABNORMAL LOW (ref 145–400)
RBC: 3.9 10*6/uL (ref 3.70–5.32)
WBC: 2.4 10*3/uL — ABNORMAL LOW (ref 3.9–10.0)

## 2010-06-17 LAB — IGG, IGA, IGM
IgA: 44 mg/dL — ABNORMAL LOW (ref 68–378)
IgG (Immunoglobin G), Serum: 557 mg/dL — ABNORMAL LOW (ref 694–1618)

## 2010-06-17 LAB — HAPTOGLOBIN: Haptoglobin: 129 mg/dL (ref 16–200)

## 2010-07-27 ENCOUNTER — Ambulatory Visit: Payer: Self-pay | Admitting: Oncology

## 2010-09-20 ENCOUNTER — Encounter: Payer: MEDICARE | Admitting: Oncology

## 2010-10-14 LAB — DIFFERENTIAL
Basophils Relative: 0 % (ref 0–1)
Lymphocytes Relative: 5 % — ABNORMAL LOW (ref 12–46)
Lymphs Abs: 0.3 10*3/uL — ABNORMAL LOW (ref 0.7–4.0)
Monocytes Absolute: 0.5 10*3/uL (ref 0.1–1.0)
Monocytes Relative: 9 % (ref 3–12)
Neutro Abs: 4.1 10*3/uL (ref 1.7–7.7)
Neutrophils Relative %: 82 % — ABNORMAL HIGH (ref 43–77)

## 2010-10-14 LAB — CBC
HCT: 37.7 % (ref 36.0–46.0)
Hemoglobin: 12.6 g/dL (ref 12.0–15.0)
MCHC: 33.4 g/dL (ref 30.0–36.0)
RBC: 3.95 MIL/uL (ref 3.87–5.11)

## 2010-10-14 LAB — POCT URINALYSIS DIPSTICK
Hgb urine dipstick: NEGATIVE
Ketones, ur: NEGATIVE mg/dL
Protein, ur: NEGATIVE mg/dL
Specific Gravity, Urine: 1.015 (ref 1.005–1.030)
Urobilinogen, UA: 0.2 mg/dL (ref 0.0–1.0)
pH: 5.5 (ref 5.0–8.0)

## 2010-10-14 LAB — POCT I-STAT, CHEM 8
BUN: 22 mg/dL (ref 6–23)
Calcium, Ion: 1.17 mmol/L (ref 1.12–1.32)
Chloride: 103 mEq/L (ref 96–112)
Creatinine, Ser: 0.9 mg/dL (ref 0.4–1.2)
Glucose, Bld: 106 mg/dL — ABNORMAL HIGH (ref 70–99)
Potassium: 4.1 mEq/L (ref 3.5–5.1)

## 2010-10-15 ENCOUNTER — Ambulatory Visit (HOSPITAL_COMMUNITY): Payer: MEDICARE | Attending: Internal Medicine

## 2010-10-15 DIAGNOSIS — M810 Age-related osteoporosis without current pathological fracture: Secondary | ICD-10-CM | POA: Insufficient documentation

## 2010-11-04 LAB — DIFFERENTIAL
Basophils Absolute: 0 10*3/uL (ref 0.0–0.1)
Lymphocytes Relative: 8 % — ABNORMAL LOW (ref 12–46)
Lymphs Abs: 0.2 10*3/uL — ABNORMAL LOW (ref 0.7–4.0)
Monocytes Absolute: 0.4 10*3/uL (ref 0.1–1.0)
Neutro Abs: 2 10*3/uL (ref 1.7–7.7)

## 2010-11-04 LAB — COMPREHENSIVE METABOLIC PANEL
AST: 19 U/L (ref 0–37)
Albumin: 4.6 g/dL (ref 3.5–5.2)
BUN: 17 mg/dL (ref 6–23)
Calcium: 9.5 mg/dL (ref 8.4–10.5)
Chloride: 99 mEq/L (ref 96–112)
Creatinine, Ser: 0.81 mg/dL (ref 0.4–1.2)
GFR calc Af Amer: >60 mL/min (ref >60)
GFR calc non Af Amer: >60 mL/min (ref >60)
Total Bilirubin: 0.8 mg/dL (ref 0.3–1.2)

## 2010-11-04 LAB — CBC
HCT: 38.2 % (ref 36.0–46.0)
MCHC: 34.4 g/dL (ref 30.0–36.0)
MCV: 104.3 fL — ABNORMAL HIGH (ref 78.0–100.0)
Platelets: 123 10*3/uL — ABNORMAL LOW (ref 150–400)
WBC: 2.7 10*3/uL — ABNORMAL LOW (ref 4.0–10.5)

## 2010-11-15 ENCOUNTER — Encounter (HOSPITAL_BASED_OUTPATIENT_CLINIC_OR_DEPARTMENT_OTHER): Payer: MEDICARE | Admitting: Oncology

## 2010-11-15 DIAGNOSIS — C9110 Chronic lymphocytic leukemia of B-cell type not having achieved remission: Secondary | ICD-10-CM

## 2010-11-15 DIAGNOSIS — Z452 Encounter for adjustment and management of vascular access device: Secondary | ICD-10-CM

## 2010-12-15 ENCOUNTER — Other Ambulatory Visit: Payer: Self-pay | Admitting: Oncology

## 2010-12-15 ENCOUNTER — Encounter (HOSPITAL_BASED_OUTPATIENT_CLINIC_OR_DEPARTMENT_OTHER): Payer: MEDICARE | Admitting: Oncology

## 2010-12-15 DIAGNOSIS — J984 Other disorders of lung: Secondary | ICD-10-CM

## 2010-12-15 DIAGNOSIS — Z452 Encounter for adjustment and management of vascular access device: Secondary | ICD-10-CM

## 2010-12-15 DIAGNOSIS — C9110 Chronic lymphocytic leukemia of B-cell type not having achieved remission: Secondary | ICD-10-CM

## 2010-12-15 LAB — CBC WITH DIFFERENTIAL/PLATELET
Basophils Absolute: 0 10*3/uL (ref 0.0–0.1)
Eosinophils Absolute: 0 10*3/uL (ref 0.0–0.5)
HCT: 37.3 % (ref 34.8–46.6)
HGB: 12.8 g/dL (ref 11.6–15.9)
LYMPH%: 13 % — ABNORMAL LOW (ref 14.0–49.7)
MONO#: 0.3 10*3/uL (ref 0.1–0.9)
NEUT%: 76.1 % (ref 38.4–76.8)
Platelets: 147 10*3/uL (ref 145–400)
WBC: 3.4 10*3/uL — ABNORMAL LOW (ref 3.9–10.3)
lymph#: 0.4 10*3/uL — ABNORMAL LOW (ref 0.9–3.3)

## 2010-12-16 LAB — COMPREHENSIVE METABOLIC PANEL
Alkaline Phosphatase: 42 U/L (ref 39–117)
BUN: 25 mg/dL — ABNORMAL HIGH (ref 6–23)
CO2: 25 mEq/L (ref 19–32)
Creatinine, Ser: 0.82 mg/dL (ref 0.40–1.20)
Glucose, Bld: 107 mg/dL — ABNORMAL HIGH (ref 70–99)
Total Bilirubin: 0.5 mg/dL (ref 0.3–1.2)

## 2010-12-16 LAB — DIRECT ANTIGLOBULIN TEST (NOT AT ARMC)
DAT (Complement): NEGATIVE
DAT IgG: NEGATIVE

## 2010-12-16 LAB — LACTATE DEHYDROGENASE: LDH: 154 U/L (ref 94–250)

## 2010-12-16 LAB — IGG, IGA, IGM: IgG (Immunoglobin G), Serum: 477 mg/dL — ABNORMAL LOW (ref 700–1600)

## 2010-12-17 NOTE — Discharge Summary (Signed)
NAMEJHOSELIN, CRUME                 ACCOUNT NO.:  [id number]   MEDICAL RECORD NO.:  [id number]          PATIENT TYPE:  OBV   LOCATION:  2041                         FACILITY:  [facility]   PHYSICIAN:  Elmore Guise., M.D.DATE OF BIRTH:  1927-07-15   DATE OF ADMISSION:  10/05/2006  DATE OF DISCHARGE:  10/06/2006                               DISCHARGE SUMMARY   DISCHARGE DIAGNOSES:  1. Atypical chest pain.  2. A history of chronic lymphocytic leukemia.  3. Hypertension.   HISTORY OF PRESENT ILLNESS:  The patient is a very pleasant 77 year old  white female who was admitted for observation after presenting with  chest pain.   HOSPITAL COURSE:  While in the emergency room, the patient was having  frequent ventricular ectopy.  She was admitted for overnight  hospitalization and observation.  She had serial cardiac enzymes  performed which were normal.  Her ventricular ectopy improved  significantly with low-dose beta blocker.  She had an echocardiogram  performed which showed normal LV function with an EF of 55%.  She had  moderate aortic insufficiency, mild mitral regurgitation, and mild to  moderate tricuspid regurgitation.  She has now been up and ambulatory  with no further symptoms.  She will be discharged home today to continue  the following medications:   1. Toprol XL 25 mg 1/2 tablet daily.  2. Hydrochlorothiazide 25 mg 1/2 tablet daily.  3. Protonix 40 mg once daily.  4. Aspirin 81 mg daily.   She will follow up with Dr. Reyes Ivan for a stress Cardiolite on Tuesday  March25 at 9:00 a.m.  She was instructed to have no caffeine for 24  hours and nothing to eat after midnight.  Should she have any further  problems prior to that time,  she is to call the office for further  recommendations.  These recommendations were given to the patient as  well as her husband.      Elmore Guise., M.D.  Electronically Signed     TWK/MEDQ  D:  10/06/2006  T:  10/07/2006  Job:   [id number]   cc:   [name], MD

## 2010-12-17 NOTE — H&P (Signed)
NAMEBRAYLEN, Kathy Franklin NO.:  [id number]   MEDICAL RECORD NO.:  [id number]           PATIENT TYPE:   LOCATION:                               FACILITY:  [facility]   PHYSICIAN:  Elmore Guise., M.D.DATE OF BIRTH:  [birth year]   DATE OF ADMISSION:  10/05/2006  DATE OF DISCHARGE:                              HISTORY & PHYSICAL   PRIMARY CARE PHYSICIAN:  [name], M.D.   REASON FOR ADMISSION:  Chest pain.   HISTORY OF PRESENT ILLNESS:  The patient is a very pleasant 77 year old  white female with past medical history of chronic lymphocytic leukemia,  hypertension, left hip replacement, and history of nonobstructive branch  vessel disease by cardiac catheterization in 1999, who presents for  evaluation of chest pain, fatigue and palpitations.  The patient reports  increased stress at home.  She has had the deaths of three recent close  friends,  the last two from heart attacks.  She states that over the  last week she has been having increased palpitations and fatigue.  Today  she went for her hair appointment, felt very light-headed.  She started  having epigastric and lower sternal chest pain that she describes as an  8.  She had radiation to her left breast and to her shoulder.  She had  no significant exertional symptoms; however, she did have mild dyspnea.  No inspiratory changes.  No fever, no cough, and no changes with food.  She does report that her stress level has been increased since she has  been having difficulty sleeping.  With her history of CLL she has CBCs  to 6 weeks and has been doing very well from this standpoint.  Review of systems are as per HPI.  All others are negative.   CURRENT MEDICATIONS:  Her current medications are hydrochlorothiazide.   ALLERGIES:  To PENICILLIN and CODEINE causing nausea.   FAMILY HISTORY:  Family history is positive for stroke and hypertension.   SOCIAL HISTORY:  She is married.  She is a retired Engineer, civil (consulting).  No  tobacco or  alcohol.   PHYSICAL EXAMINATION:  Blood pressure is 119/85, temperature is 98.2,  heart rate is 75 and irregular with frequent PVCs and trigeminy noted on  tele monitoring.  Respirations are 16.  She is saturating 98% on room  air.  GENERAL:  In general she is a very pleasant, elderly, white female,  alert and oriented x4.  No acute distress.  She has no JVD and no bruits.  LUNGS:  Lungs were clear.  HEART:  Heart is regular with no significant murmurs, gallops, or rubs.  Occasional ectopy was noted.  ABDOMEN:  Abdomen is soft, nontender, nondistended.  No rebound or  guarding.  EXTREMITIES:  Warm with 2+ pulses and no significant edema.   LABORATORY DATA:  Her lab work shows a BUN and creatinine of 27 and 1,  potassium of 3.5, a D-dimer of 0.22.  Myoglobin of 64.3, MB of less than  1, and troponin I less than 0.05.  Her white count is 32,000 with 80%  lymphs, hemoglobin is 12.4, and platelet count is 217.   CHEST X-RAY:  Shows no acute cardiopulmonary disease.  ECG shows normal  sinus rhythm, occasional PVC. and septal Q-waves but nonspecific T-wave  flattening and ST changes in her lateral precordial leads.  Tele showed  frequent PVCs and trigeminy noted on the monitor.   IMPRESSION:  1. Chest pain (atypical and typical qualities).  2. History of hypertension.  3. History of branch vessel coronary disease with 50% diagonal lesion      noted by cardiac catheterization in 1999.  Last stress evaluation      was September 24, 2004, which showed no perfusion defects and normal      LV systolic function with an EF of 54%.  4. History of chronic lymphocytic leukemia.   PLAN:  1. The patient will be admitted for 24-hour observation status.  We      will continue serial cardiac enzymes, will add Lopressor 12.5 mg      b.i.d. Continue aspirin 325 mg daily and add Protonix 40 mg once      daily.  She will be given one dose of Lovenox 1 mg/kg and will      check an echo in  the morning.  If her symptoms resolve and her      enzymes are negative, outpatient workup will be entertained,      otherwise if her symptoms continue I did discuss possibility of      inpatient evaluation including cardiac catheterization.      Elmore Guise., M.D.  Electronically Signed     TWK/MEDQ  D:  10/05/2006  T:  10/05/2006  Job:  [id number]   cc:   [name], MD

## 2010-12-17 NOTE — Consult Note (Signed)
Kathy Franklin, Kathy Franklin NO.:  [id number]   MEDICAL RECORD NO.:  [id number]          PATIENT TYPE:  EMS   LOCATION:  MAJO                         FACILITY:  [facility]   PHYSICIAN:  Elmore Guise., M.D.DATE OF BIRTH:  10/04/1926   DATE OF CONSULTATION:  09/20/2004  DATE OF DISCHARGE:  09/20/2004                                   CONSULTATION   PRIMARY CARE PHYSICIAN:  [name], M.D.   HISTORY OF PRESENT ILLNESS:  The patient is a very pleasant 77 year old  white female with past medical history of CLL, left hip replacement and  nonobstructive coronary arteries by cardiac catheterization in 1999,  presents to the ER with three day history of constant, more sternal and  middle left breast chest pain.  The patient reports the pain started acutely  a couple of days ago.  No significant exertional component with it.  Her  symptoms had been continuous over the last couple of days.  She denies any  trauma or any new activities that may have caused any irritation to the  area.  She denies any changes with her pain with food intake.  She does  report mild improvement on sitting forward, otherwise no significant  changes.  She has been taking Ultram with some improvement in her symptoms.  She denies any significant fever, chills, nausea, vomiting, diarrhea, no  cough.  She does have occasional warm flashes which have gone on for some  time.  Otherwise review of systems are negative.   CURRENT MEDICATIONS:  1.  Ultram.  2.  Ambien.  3.  Fosamax.  4.  Excedrin.  5.  Iron.  6.  Hydrochlorothiazide.   ALLERGIES:  PENICILLIN.   FAMILY HISTORY:  Positive for hypertension and stroke.   SOCIAL HISTORY:  She is married.  No tobacco, no alcohol.  She is a retired  Engineer, civil (consulting).   PHYSICAL. EXAMINATION:  GENERAL APPEARANCE:  She is a very pleasant white  female, alert and oriented x4 in no acute distress.  VITAL SIGNS:  She is afebrile.  Pulse ranged from 60 to 70, O2  saturation  95%, blood pressure 120/70.  NECK:  Supple.  No lymphadenopathy, 2+ carotids, no JVD, no bruits.  LUNGS:  Clear.  CARDIOVASCULAR:  Regular with normal S1 and S2, no significant murmurs,  rubs, or gallops.  Lower sternal area is tender to palpation at the  costochondral junction at the midline and at the left area.  Also tender in  the medial portion of her left breast.  ABDOMEN:  Soft, nontender and nondistended.  EXTREMITIES:  Warm with 2+ pulses and no edema.  NEUROLOGIC:  Intact with no focal deficits.   LABORATORY DATA:  D-dimer 0.38, BUN and creatinine are 23 and 1.2.  Electrolytes are normal.  Cardiac enzymes are negative.  Myoglobin of 70, MB  less than 1, and troponin I less than 0.05.   Chest x-ray showed no acute cardiopulmonary disease.  CT scan showed no PE.   ECG shows normal sinus rhythm, rate of 67 per minute, occasional PVC and  PAC,  nonspecific ST and T-wave changes were noted.  No significant changes  were noted.  No significant changes from prior ECG.   IMPRESSION:  1.  Most likely musculoskeletal.  2.  History of nonobstructive coronary arteries by cardiac catheterization      in 1999.  The patient did have mild branch vessel disease noted at that      time.   PLAN:  From cardiovascular standpoint, the patient is stable and doing well.  I would recommend Toradol 30 mg IV now, then ibuprofen 1 to 2 mg q.8h. x3  days.  Patient should take a daily aspirin 81 mg per day and she will follow  up for outpatient stress test.  Should she have any further questions, she  is to notify the office for further instructions.      TWK/MEDQ  D:  09/20/2004  T:  09/20/2004  Job:  [id number]   cc:   [name], MD  Fax: [phone]

## 2010-12-17 NOTE — Op Note (Signed)
NAMEELENI, Kathy Franklin                 ACCOUNT NO.:  [id number]   MEDICAL RECORD NO.:  [id number]          PATIENT TYPE:  AMB   LOCATION:  ENDO                         FACILITY:  [facility]   PHYSICIAN:  Bernette Redbird, M.D.   DATE OF BIRTH:  Aug 20, 1926   DATE OF PROCEDURE:  04/21/2004  DATE OF DISCHARGE:                                 OPERATIVE REPORT   PROCEDURE:  Upper endoscopy with biopsies.   ENDOSCOPIST:  Bernette Redbird, M.D.   INDICATION:  Mild iron deficiency anemia (hemoglobin 11.8, ferritin 7) in a  very pleasant 77 year old female with a diagnosis of chronic lymphocytic  leukemia.   FINDINGS:  Mild antral gastritis with focal erosions.  Small hiatal hernia.   PROCEDURE:  The nature, purpose and risks of the procedure had been  discussed with the patient, who provided written consent.  Sedation was  fentanyl 50 mcg and Versed 5 mg IV without arrhythmias or desaturation.  The  Olympus small-caliber adult video endoscope was passed under direct vision.  The vocal cords looked normal.  The esophagus was readily entered and had  entirely normal mucosa without evidence of reflux esophagitis, free reflux,  Barrett's esophagus, varices, infection, neoplasia or any ring or stricture.  A 2-cm hiatal hernia was intermittently present.  The stomach contained a  small clear bile-tinged residual.  The antrum of the stomach had some focal  erythematous areas as might be seen with aspirin exposure, although the  history I obtained from the patient did not indicate any such exposure other  than Fosamax; specifically, no daily aspirin, no NSAID exposure.  No Kathy Franklin  ulcerations were seen and there was no evidence of polyps, cancer, diffuse  gastritis or vascular malformations.  The pylorus, duodenal bulb and the  second duodenum looked normal, but I did obtain random mucosal duodenal  biopsies to help rule out occult celiac disease which can sometimes present  as iron deficiency anemia.  The  scope was then removed from the patient, who  tolerated the procedure well without apparent complication.   IMPRESSION:  1.  Iron deficiency anemia (280.9) without definite source on this exam.  2.  Mild antral erosive gastritis of unclear etiology; looks like aspirin      exposure, despite absence of that history.   PLAN:  Await pathology on duodenal biopsies and proceed to colonoscopic  evaluation.  I do not think that the observed findings are of sufficient  severity to warrant PPI administration.       RB/MEDQ  D:  04/21/2004  T:  04/22/2004  Job:  [id number]   cc:   Drue Second, MD  Fax: [phone]   [name], M.D.  Fax: [phone]

## 2011-01-07 ENCOUNTER — Other Ambulatory Visit: Payer: Self-pay | Admitting: Dermatology

## 2011-03-04 ENCOUNTER — Encounter (HOSPITAL_BASED_OUTPATIENT_CLINIC_OR_DEPARTMENT_OTHER): Payer: Medicare Other | Admitting: Oncology

## 2011-03-04 DIAGNOSIS — Z452 Encounter for adjustment and management of vascular access device: Secondary | ICD-10-CM

## 2011-03-04 DIAGNOSIS — C9110 Chronic lymphocytic leukemia of B-cell type not having achieved remission: Secondary | ICD-10-CM

## 2011-05-09 ENCOUNTER — Encounter (HOSPITAL_BASED_OUTPATIENT_CLINIC_OR_DEPARTMENT_OTHER): Payer: Medicare Other | Admitting: Oncology

## 2011-05-09 DIAGNOSIS — C9110 Chronic lymphocytic leukemia of B-cell type not having achieved remission: Secondary | ICD-10-CM

## 2011-05-09 DIAGNOSIS — Z452 Encounter for adjustment and management of vascular access device: Secondary | ICD-10-CM

## 2011-05-10 LAB — I-STAT 8, (EC8 V) (CONVERTED LAB)
Acid-Base Excess: 3 — ABNORMAL HIGH
Glucose, Bld: 104 — ABNORMAL HIGH
Hemoglobin: 15
Potassium: 3.8
Sodium: 139
TCO2: 29

## 2011-05-10 LAB — CBC
HCT: 40.4
Hemoglobin: 13.5
Platelets: 199
WBC: 38.8 — ABNORMAL HIGH

## 2011-05-10 LAB — DIFFERENTIAL
Basophils Absolute: 0
Eosinophils Absolute: 0.4
Lymphocytes Relative: 85 — ABNORMAL HIGH
Monocytes Relative: 3
Neutro Abs: 4.3
Neutrophils Relative %: 11 — ABNORMAL LOW

## 2011-05-10 LAB — HEPATIC FUNCTION PANEL
ALT: 15
Bilirubin, Direct: 0.1
Indirect Bilirubin: 0.5

## 2011-05-10 LAB — CULTURE, BLOOD (ROUTINE X 2): Culture: NO GROWTH

## 2011-05-10 LAB — POCT CARDIAC MARKERS
CKMB, poc: 1.2
Myoglobin, poc: 72.9
Operator id: 272551

## 2011-05-10 LAB — URINALYSIS, ROUTINE W REFLEX MICROSCOPIC
Bilirubin Urine: NEGATIVE
Ketones, ur: NEGATIVE
Nitrite: NEGATIVE
Protein, ur: NEGATIVE
Urobilinogen, UA: 0.2

## 2011-05-10 LAB — D-DIMER, QUANTITATIVE: D-Dimer, Quant: <0.22

## 2011-05-10 LAB — POCT I-STAT CREATININE
Creatinine, Ser: 0.8
Operator id: 272551

## 2011-05-24 ENCOUNTER — Other Ambulatory Visit: Payer: Self-pay | Admitting: Dermatology

## 2011-07-11 ENCOUNTER — Other Ambulatory Visit: Payer: Self-pay | Admitting: Oncology

## 2011-07-11 ENCOUNTER — Ambulatory Visit (HOSPITAL_BASED_OUTPATIENT_CLINIC_OR_DEPARTMENT_OTHER): Payer: Medicare Other

## 2011-07-11 ENCOUNTER — Other Ambulatory Visit (HOSPITAL_BASED_OUTPATIENT_CLINIC_OR_DEPARTMENT_OTHER): Payer: Medicare Other | Admitting: Lab

## 2011-07-11 DIAGNOSIS — C9110 Chronic lymphocytic leukemia of B-cell type not having achieved remission: Secondary | ICD-10-CM

## 2011-07-11 DIAGNOSIS — Z452 Encounter for adjustment and management of vascular access device: Secondary | ICD-10-CM

## 2011-07-11 DIAGNOSIS — C9590 Leukemia, unspecified not having achieved remission: Secondary | ICD-10-CM

## 2011-07-11 LAB — CBC WITH DIFFERENTIAL/PLATELET
Eosinophils Absolute: 0 10*3/uL (ref 0.0–0.5)
MONO#: 0.3 10*3/uL (ref 0.1–0.9)
MONO%: 9.1 % (ref 0.0–14.0)
NEUT#: 2.4 10*3/uL (ref 1.5–6.5)
RBC: 3.93 10*6/uL (ref 3.70–5.45)
RDW: 13 % (ref 11.2–14.5)
WBC: 3.2 10*3/uL — ABNORMAL LOW (ref 3.9–10.3)

## 2011-07-11 MED ORDER — HEPARIN SOD (PORK) LOCK FLUSH 100 UNIT/ML IV SOLN
500.0000 [IU] | Freq: Once | INTRAVENOUS | Status: AC
  Administered 2011-07-11: 500 [IU] via INTRAVENOUS
  Filled 2011-07-11: qty 5

## 2011-07-11 MED ORDER — SODIUM CHLORIDE 0.9 % IJ SOLN
10.0000 mL | INTRAMUSCULAR | Status: DC | PRN
  Administered 2011-07-11: 10 mL via INTRAVENOUS
  Filled 2011-07-11: qty 10

## 2011-07-11 MED ORDER — ALTEPLASE 2 MG IJ SOLR
2.0000 mg | Freq: Once | INTRAMUSCULAR | Status: DC | PRN
  Filled 2011-07-11: qty 2

## 2011-07-11 NOTE — Patient Instructions (Signed)
Instructed to call for any concerns-voices understanding 

## 2011-07-12 LAB — COMPREHENSIVE METABOLIC PANEL
ALT: 12 U/L (ref 0–35)
Albumin: 4.3 g/dL (ref 3.5–5.2)
Alkaline Phosphatase: 37 U/L — ABNORMAL LOW (ref 39–117)
CO2: 26 mEq/L (ref 19–32)
Glucose, Bld: 126 mg/dL — ABNORMAL HIGH (ref 70–99)
Potassium: 4.5 mEq/L (ref 3.5–5.3)
Sodium: 140 mEq/L (ref 135–145)
Total Protein: 6 g/dL (ref 6.0–8.3)

## 2011-07-12 LAB — IGG, IGA, IGM: IgG (Immunoglobin G), Serum: 495 mg/dL — ABNORMAL LOW (ref 690–1700)

## 2011-07-12 LAB — DIRECT ANTIGLOBULIN TEST (NOT AT ARMC)
DAT (Complement): NEGATIVE
DAT IgG: NEGATIVE

## 2011-07-20 ENCOUNTER — Encounter: Payer: Self-pay | Admitting: Oncology

## 2011-07-20 ENCOUNTER — Ambulatory Visit (HOSPITAL_BASED_OUTPATIENT_CLINIC_OR_DEPARTMENT_OTHER): Payer: Medicare Other | Admitting: Oncology

## 2011-07-20 ENCOUNTER — Telehealth: Payer: Self-pay | Admitting: *Deleted

## 2011-07-20 VITALS — BP 152/80 | HR 65 | Temp 97.5°F | Wt 125.1 lb

## 2011-07-20 DIAGNOSIS — C9110 Chronic lymphocytic leukemia of B-cell type not having achieved remission: Secondary | ICD-10-CM

## 2011-07-20 DIAGNOSIS — Z856 Personal history of leukemia: Secondary | ICD-10-CM

## 2011-07-20 DIAGNOSIS — R911 Solitary pulmonary nodule: Secondary | ICD-10-CM

## 2011-07-20 HISTORY — DX: Chronic lymphocytic leukemia of B-cell type not having achieved remission: C91.10

## 2011-07-20 NOTE — Progress Notes (Signed)
CC: [name], MD   DIAGNOSIS:  77 year old female with chronic lymphocytic leukemia stage II.  PAST THERAPY:  Status post 6 cycles of fludarabine and Rituxan administered from October 20, 2008 through March 16, 2009.  CURRENT THERAPY:  Observation.  INTERVAL HISTORY:  Patient is seen in followup today at 6 months time. Overall she seems to be continuing to do well. Her blood pressure is a little bit of but she does tell me that her brother is visiting her in town and her issues going on. She otherwise denies any fevers chills night sweats headaches shortness of breath chest pain palpitations. She does have some occasional abdominal pain do to her ischemic colitis. She has no diarrhea or constipation no peripheral paresthesias no recent hospitalizations no infections. Remainder of the 10 point review of systems is negative. ALLERGIES:  Penicillin and codeine.  CURRENT MEDICATIONS:  Calcium, vitamin D, omega-3.  PHYSICAL EXAMINATION:  General:  The patient is awake, alert in no acute distress.  She appears well.   Vital Signs:   Filed Vitals:   07/20/11 1151  BP: 152/80  Pulse: 65  Temp: 97.5 F (36.4 C)   HEENT:  EOMI.  PERRLA.  Sclerae anicteric.  No conjunctival pallor.  Oral mucosa is moist.  Neck:  Supple.  Lungs:  Clear bilaterally to auscultation and percussion.  Cardiovascular:  Regular rate rhythm.  No murmurs.  Abdomen:  Soft, nontender, nondistended.  Bowel sounds are present.  No HSM.  Extremities:  No edema.  Neuro:  Patient is alert, oriented, otherwise nonfocal.  LABORATORY DATA:   Lab Results  Component Value Date   WBC 3.2* 07/11/2011   HGB 12.9 07/11/2011   HCT 37.8 07/11/2011   MCV 96.2 07/11/2011   PLT 147 07/11/2011     Chemistry      Component Value Date/Time   NA 140 07/11/2011 1059   NA 140 07/11/2011 1059   NA 140 07/11/2011 1059   NA 141 06/16/2010 1101   K 4.5 07/11/2011 1059   K 4.5 07/11/2011 1059   K 4.5 07/11/2011 1059   K 4.8* 06/16/2010  1101   CL 103 07/11/2011 1059   CL 103 07/11/2011 1059   CL 103 07/11/2011 1059   CL 103 06/16/2010 1101   CO2 26 07/11/2011 1059   CO2 26 07/11/2011 1059   CO2 26 07/11/2011 1059   CO2 30 06/16/2010 1101   BUN 22 07/11/2011 1059   BUN 22 07/11/2011 1059   BUN 22 07/11/2011 1059   BUN 25* 06/16/2010 1101   CREATININE 0.83 07/11/2011 1059   CREATININE 0.83 07/11/2011 1059   CREATININE 0.83 07/11/2011 1059   CREATININE 0.9 06/16/2010 1101      Component Value Date/Time   CALCIUM 8.9 07/11/2011 1059   CALCIUM 8.9 07/11/2011 1059   CALCIUM 8.9 07/11/2011 1059   CALCIUM 9.7 06/16/2010 1101   ALKPHOS 37* 07/11/2011 1059   ALKPHOS 37* 07/11/2011 1059   ALKPHOS 37* 07/11/2011 1059   ALKPHOS 38 06/16/2010 1101   AST 19 07/11/2011 1059   AST 19 07/11/2011 1059   AST 19 07/11/2011 1059   AST 23 06/16/2010 1101   ALT 12 07/11/2011 1059   ALT 12 07/11/2011 1059   ALT 12 07/11/2011 1059   BILITOT 0.4 07/11/2011 1059   BILITOT 0.4 07/11/2011 1059   BILITOT 0.4 07/11/2011 1059   BILITOT 0.60 06/16/2010 1101       IMPRESSION AND PLAN:  An 77 year old female with: 1. Stage  II chronic lymphocytic leukemia status post 6 cycles of fludarabine and Rituxan completed March 16, 2009 without evidence of recurrent disease. 2. History of ischemic colitis. 3. Pulmonary nodules, unclear etiology.  She will continue to follow with Dr. Timothy Lasso in regards to that. 4. We did have difficulty with her Port-A-Cath and we did use Cathflo to try to keep it open. 5. Patient will see me back in 6 months time for followup however she knows to come back sooner if need arises. Copies of her blood tests were given to her. All questions were answered today. 6. I spent 30 minutes with the patient greater than 50% of the time was spent in counseling and coordination of care.

## 2011-07-20 NOTE — Telephone Encounter (Signed)
gave patient appointment for 01-27-2012 starting at 11:00am printed out calendar and gave to the patient

## 2011-08-22 ENCOUNTER — Ambulatory Visit (HOSPITAL_BASED_OUTPATIENT_CLINIC_OR_DEPARTMENT_OTHER): Payer: Medicare Other

## 2011-08-22 ENCOUNTER — Telehealth: Payer: Self-pay | Admitting: Oncology

## 2011-08-22 VITALS — BP 132/72 | HR 61 | Temp 97.8°F

## 2011-08-22 DIAGNOSIS — C9590 Leukemia, unspecified not having achieved remission: Secondary | ICD-10-CM

## 2011-08-22 MED ORDER — HEPARIN SOD (PORK) LOCK FLUSH 100 UNIT/ML IV SOLN
500.0000 [IU] | Freq: Once | INTRAVENOUS | Status: AC
  Administered 2011-08-22: 500 [IU] via INTRAVENOUS
  Filled 2011-08-22: qty 5

## 2011-08-22 MED ORDER — SODIUM CHLORIDE 0.9 % IJ SOLN
10.0000 mL | INTRAMUSCULAR | Status: DC | PRN
  Administered 2011-08-22: 10 mL via INTRAVENOUS
  Filled 2011-08-22: qty 10

## 2011-08-22 NOTE — Telephone Encounter (Signed)
Pt came in today to pick up new schedule for march thru june

## 2011-10-03 ENCOUNTER — Ambulatory Visit (HOSPITAL_BASED_OUTPATIENT_CLINIC_OR_DEPARTMENT_OTHER): Payer: Medicare Other

## 2011-10-03 DIAGNOSIS — C9590 Leukemia, unspecified not having achieved remission: Secondary | ICD-10-CM

## 2011-10-03 DIAGNOSIS — Z452 Encounter for adjustment and management of vascular access device: Secondary | ICD-10-CM

## 2011-10-03 MED ORDER — SODIUM CHLORIDE 0.9 % IJ SOLN
10.0000 mL | INTRAMUSCULAR | Status: DC | PRN
  Administered 2011-10-03: 10 mL via INTRAVENOUS
  Filled 2011-10-03: qty 10

## 2011-10-03 MED ORDER — HEPARIN SOD (PORK) LOCK FLUSH 100 UNIT/ML IV SOLN
500.0000 [IU] | Freq: Once | INTRAVENOUS | Status: AC
  Administered 2011-10-03: 500 [IU] via INTRAVENOUS
  Filled 2011-10-03: qty 5

## 2011-11-15 ENCOUNTER — Other Ambulatory Visit: Payer: Self-pay | Admitting: Gastroenterology

## 2011-11-28 ENCOUNTER — Telehealth: Payer: Self-pay | Admitting: Oncology

## 2011-11-28 NOTE — Telephone Encounter (Signed)
Pt lmonvm to cx 4/29 flush due to she has to leave town and will call back to r/s.

## 2011-12-05 ENCOUNTER — Other Ambulatory Visit: Payer: Self-pay | Admitting: Medical Oncology

## 2011-12-05 ENCOUNTER — Ambulatory Visit (HOSPITAL_BASED_OUTPATIENT_CLINIC_OR_DEPARTMENT_OTHER): Payer: Medicare Other

## 2011-12-05 VITALS — BP 121/78 | HR 63 | Temp 97.5°F

## 2011-12-05 DIAGNOSIS — C9110 Chronic lymphocytic leukemia of B-cell type not having achieved remission: Secondary | ICD-10-CM

## 2011-12-05 DIAGNOSIS — Z452 Encounter for adjustment and management of vascular access device: Secondary | ICD-10-CM

## 2011-12-05 MED ORDER — SODIUM CHLORIDE 0.9 % IJ SOLN
10.0000 mL | INTRAMUSCULAR | Status: DC | PRN
  Administered 2011-12-05: 10 mL via INTRAVENOUS
  Filled 2011-12-05: qty 10

## 2011-12-05 MED ORDER — HEPARIN SOD (PORK) LOCK FLUSH 100 UNIT/ML IV SOLN
500.0000 [IU] | Freq: Once | INTRAVENOUS | Status: AC
  Administered 2011-12-05: 500 [IU] via INTRAVENOUS
  Filled 2011-12-05: qty 5

## 2011-12-05 MED ORDER — LIDOCAINE-PRILOCAINE 2.5-2.5 % EX CREA: TOPICAL_CREAM | CUTANEOUS | Status: AC | PRN

## 2011-12-27 ENCOUNTER — Telehealth: Payer: Self-pay | Admitting: Oncology

## 2011-12-27 NOTE — Telephone Encounter (Signed)
Pt lmonvm to cx flush today and she will call back to r/s.

## 2011-12-29 ENCOUNTER — Other Ambulatory Visit: Payer: Self-pay | Admitting: Dermatology

## 2011-12-30 ENCOUNTER — Encounter: Payer: Self-pay | Admitting: *Deleted

## 2012-01-27 ENCOUNTER — Encounter: Payer: Self-pay | Admitting: Oncology

## 2012-01-27 ENCOUNTER — Telehealth: Payer: Self-pay | Admitting: Oncology

## 2012-01-27 ENCOUNTER — Ambulatory Visit (HOSPITAL_BASED_OUTPATIENT_CLINIC_OR_DEPARTMENT_OTHER): Payer: Medicare Other | Admitting: Oncology

## 2012-01-27 ENCOUNTER — Other Ambulatory Visit: Payer: Medicare Other | Admitting: Lab

## 2012-01-27 VITALS — BP 149/72 | HR 55 | Temp 97.0°F | Ht 66.0 in | Wt 122.6 lb

## 2012-01-27 DIAGNOSIS — C9110 Chronic lymphocytic leukemia of B-cell type not having achieved remission: Secondary | ICD-10-CM

## 2012-01-27 LAB — CBC WITH DIFFERENTIAL/PLATELET
BASO%: 0.9 % (ref 0.0–2.0)
Eosinophils Absolute: 0 10*3/uL (ref 0.0–0.5)
LYMPH%: 21.2 % (ref 14.0–49.7)
MCHC: 33.2 g/dL (ref 31.5–36.0)
MONO#: 0.3 10*3/uL (ref 0.1–0.9)
MONO%: 11.3 % (ref 0.0–14.0)
NEUT#: 2 10*3/uL (ref 1.5–6.5)
Platelets: 157 10*3/uL (ref 145–400)
RBC: 3.97 10*6/uL (ref 3.70–5.45)
RDW: 13.3 % (ref 11.2–14.5)
WBC: 3 10*3/uL — ABNORMAL LOW (ref 3.9–10.3)

## 2012-01-27 NOTE — Telephone Encounter (Signed)
gve the pt her jan 2014 appt calendar °

## 2012-01-27 NOTE — Progress Notes (Signed)
CC: [name], MD   DIAGNOSIS:  78 year old female with chronic lymphocytic leukemia stage II.  PAST THERAPY:  Status post 6 cycles of fludarabine and Rituxan administered from October 20, 2008 through March 16, 2009.  CURRENT THERAPY:  Observation.  INTERVAL HISTORY:  Patient is seen in followup today at 6 months time. Overall she seems to be continuing to do well. She does have left hip pain which is being monitored by Dr. Marton Redwood also receiving tramadol. She does tell me that the tramadol helps her. She otherwise denies any nausea vomiting fevers chills night sweats headaches shortness of breath chest pains palpitations she has no bleeding she has not noticed any lumps or bumps. Remainder of the 10 point review of systems is negative.  ALLERGIES:  Penicillin and codeine.  CURRENT MEDICATIONS:  Calcium, vitamin D, omega-3.  PHYSICAL EXAMINATION:  General:  The patient is awake, alert in no acute distress.  She appears well.   Vital Signs:   Filed Vitals:   01/27/12 1120  BP: 149/72  Pulse: 55  Temp: 97 F (36.1 C)   HEENT:  EOMI.  PERRLA.  Sclerae anicteric.  No conjunctival pallor.  Oral mucosa is moist.  Neck:  Supple.  Lungs:  Clear bilaterally to auscultation and percussion.  Cardiovascular:  Regular rate rhythm.  No murmurs.  Abdomen:  Soft, nontender, nondistended.  Bowel sounds are present.  No HSM.  Extremities:  No edema.  Neuro:  Patient is alert, oriented, otherwise nonfocal.  LABORATORY DATA:   Lab Results  Component Value Date   WBC 3.0* 01/27/2012   HGB 12.6 01/27/2012   HCT 38.0 01/27/2012   MCV 95.9 01/27/2012   PLT 157 01/27/2012     Chemistry      Component Value Date/Time   NA 140 07/11/2011 1059   NA 140 07/11/2011 1059   NA 140 07/11/2011 1059   NA 141 06/16/2010 1101   K 4.5 07/11/2011 1059   K 4.5 07/11/2011 1059   K 4.5 07/11/2011 1059   K 4.8* 06/16/2010 1101   CL 103 07/11/2011 1059   CL 103 07/11/2011 1059   CL 103 07/11/2011 1059   CL  103 06/16/2010 1101   CO2 26 07/11/2011 1059   CO2 26 07/11/2011 1059   CO2 26 07/11/2011 1059   CO2 30 06/16/2010 1101   BUN 22 07/11/2011 1059   BUN 22 07/11/2011 1059   BUN 22 07/11/2011 1059   BUN 25* 06/16/2010 1101   CREATININE 0.83 07/11/2011 1059   CREATININE 0.83 07/11/2011 1059   CREATININE 0.83 07/11/2011 1059   CREATININE 0.9 06/16/2010 1101      Component Value Date/Time   CALCIUM 8.9 07/11/2011 1059   CALCIUM 8.9 07/11/2011 1059   CALCIUM 8.9 07/11/2011 1059   CALCIUM 9.7 06/16/2010 1101   ALKPHOS 37* 07/11/2011 1059   ALKPHOS 37* 07/11/2011 1059   ALKPHOS 37* 07/11/2011 1059   ALKPHOS 38 06/16/2010 1101   AST 19 07/11/2011 1059   AST 19 07/11/2011 1059   AST 19 07/11/2011 1059   AST 23 06/16/2010 1101   ALT 12 07/11/2011 1059   ALT 12 07/11/2011 1059   ALT 12 07/11/2011 1059   BILITOT 0.4 07/11/2011 1059   BILITOT 0.4 07/11/2011 1059   BILITOT 0.4 07/11/2011 1059   BILITOT 0.60 06/16/2010 1101       IMPRESSION AND PLAN:  An 78 year old female with: 1. Stage II chronic lymphocytic leukemia status post 6 cycles of fludarabine and Rituxan  completed March 16, 2009 without evidence of recurrent disease. 2. History of ischemic colitis.This seems to be pretty stable now.  #3 left hip pain patient has been having this for quite some time. I offered to get an x-ray of the hip but she has declined it at least for now. She states that if she continues to have the hip pain she'll go see Dr. Timothy Lasso. She is taking tramadol for it.  #4 patient will be seen back in 6 months time in followup

## 2012-01-27 NOTE — Patient Instructions (Addendum)
No evidence of recurrent leukemia  I will see you back in 6 months

## 2012-01-28 LAB — COMPREHENSIVE METABOLIC PANEL
ALT: 10 U/L (ref 0–35)
Albumin: 4.3 g/dL (ref 3.5–5.2)
Alkaline Phosphatase: 41 U/L (ref 39–117)
CO2: 24 mEq/L (ref 19–32)
Glucose, Bld: 121 mg/dL — ABNORMAL HIGH (ref 70–99)
Potassium: 4.6 mEq/L (ref 3.5–5.3)
Sodium: 139 mEq/L (ref 135–145)
Total Bilirubin: 0.5 mg/dL (ref 0.3–1.2)
Total Protein: 5.9 g/dL — ABNORMAL LOW (ref 6.0–8.3)

## 2012-01-28 LAB — HAPTOGLOBIN: Haptoglobin: 102 mg/dL (ref 45–215)

## 2012-04-13 ENCOUNTER — Other Ambulatory Visit: Payer: Self-pay | Admitting: Orthopaedic Surgery

## 2012-04-13 DIAGNOSIS — M545 Low back pain: Secondary | ICD-10-CM

## 2012-04-23 ENCOUNTER — Other Ambulatory Visit: Payer: Medicare Other

## 2012-04-27 ENCOUNTER — Ambulatory Visit
Admission: RE | Admit: 2012-04-27 | Discharge: 2012-04-27 | Disposition: A | Discharge status: Home / Self Care | Payer: Medicare Other | Source: Ambulatory Visit | Attending: Orthopaedic Surgery | Admitting: Orthopaedic Surgery

## 2012-04-27 DIAGNOSIS — M545 Low back pain: Secondary | ICD-10-CM

## 2012-08-03 ENCOUNTER — Other Ambulatory Visit: Payer: Self-pay | Admitting: Dermatology

## 2012-08-31 ENCOUNTER — Telehealth: Payer: Self-pay | Admitting: Oncology

## 2012-08-31 ENCOUNTER — Encounter: Payer: Self-pay | Admitting: Oncology

## 2012-08-31 ENCOUNTER — Ambulatory Visit (HOSPITAL_BASED_OUTPATIENT_CLINIC_OR_DEPARTMENT_OTHER): Payer: Medicare Other | Admitting: Oncology

## 2012-08-31 ENCOUNTER — Other Ambulatory Visit (HOSPITAL_BASED_OUTPATIENT_CLINIC_OR_DEPARTMENT_OTHER): Payer: Medicare Other | Admitting: Lab

## 2012-08-31 VITALS — BP 139/66 | HR 66 | Temp 97.5°F | Resp 16 | Wt 121.0 lb

## 2012-08-31 DIAGNOSIS — C9110 Chronic lymphocytic leukemia of B-cell type not having achieved remission: Secondary | ICD-10-CM

## 2012-08-31 LAB — CBC WITH DIFFERENTIAL/PLATELET
Basophils Absolute: 0 10*3/uL (ref 0.0–0.1)
Eosinophils Absolute: 0.1 10*3/uL (ref 0.0–0.5)
HCT: 39.4 % (ref 34.8–46.6)
HGB: 13.3 g/dL (ref 11.6–15.9)
LYMPH%: 20.2 % (ref 14.0–49.7)
MCV: 93.4 fL (ref 79.5–101.0)
MONO%: 10.2 % (ref 0.0–14.0)
NEUT#: 3 10*3/uL (ref 1.5–6.5)
Platelets: 171 10*3/uL (ref 145–400)

## 2012-08-31 LAB — COMPREHENSIVE METABOLIC PANEL (CC13)
BUN: 20.9 mg/dL (ref 7.0–26.0)
CO2: 25 mEq/L (ref 22–29)
Calcium: 8.9 mg/dL (ref 8.4–10.4)
Chloride: 105 mEq/L (ref 98–107)
Creatinine: 0.8 mg/dL (ref 0.6–1.1)
Glucose: 80 mg/dl (ref 70–99)
Total Bilirubin: 0.54 mg/dL (ref 0.20–1.20)

## 2012-08-31 NOTE — Telephone Encounter (Signed)
gv pt appt schedule for July.  

## 2012-08-31 NOTE — Progress Notes (Signed)
  CC: Kathy Pounds, MD   DIAGNOSIS:  79 year old female with chronic lymphocytic leukemia stage II.  PAST THERAPY:  Status post 6 cycles of fludarabine and Rituxan administered from October 20, 2008 through March 16, 2009.  CURRENT THERAPY:  Observation.  INTERVAL HISTORY:  Patient is seen in followup today at 6 months time. Overall she seems to be continuing to do well. She does have left hip pain which is being monitored by Dr. Marton Redwood also receiving tramadol. She does tell me that the tramadol helps her. She otherwise denies any nausea vomiting fevers chills night sweats headaches shortness of breath chest pains palpitations she has no bleeding she has not noticed any lumps or bumps. Remainder of the 10 point review of systems is negative.  ALLERGIES:  Penicillin and codeine.  CURRENT MEDICATIONS:  Calcium, vitamin D, omega-3.  PHYSICAL EXAMINATION:  General:  The patient is awake, alert in no acute distress.  She appears well.   Vital Signs:   Filed Vitals:   08/31/12 1128  BP: 139/66  Pulse: 66  Temp: 97.5 F (36.4 C)  Resp: 16   HEENT:  EOMI.  PERRLA.  Sclerae anicteric.  No conjunctival pallor.  Oral mucosa is moist.  Neck:  Supple.  Lungs:  Clear bilaterally to auscultation and percussion.  Cardiovascular:  Regular rate rhythm.  No murmurs.  Abdomen:  Soft, nontender, nondistended.  Bowel sounds are present.  No HSM.  Extremities:  No edema.  Neuro:  Patient is alert, oriented, otherwise nonfocal.  LABORATORY DATA:   Lab Results  Component Value Date   WBC 4.5 08/31/2012   HGB 13.3 08/31/2012   HCT 39.4 08/31/2012   MCV 93.4 08/31/2012   PLT 171 08/31/2012     Chemistry      Component Value Date/Time   NA 139 01/27/2012 1100   NA 141 06/16/2010 1101   K 4.6 01/27/2012 1100   K 4.8* 06/16/2010 1101   CL 104 01/27/2012 1100   CL 103 06/16/2010 1101   CO2 24 01/27/2012 1100   CO2 30 06/16/2010 1101   BUN 25* 01/27/2012 1100   BUN 25* 06/16/2010 1101   CREATININE 0.84  01/27/2012 1100   CREATININE 0.9 06/16/2010 1101      Component Value Date/Time   CALCIUM 8.5 01/27/2012 1100   CALCIUM 9.7 06/16/2010 1101   ALKPHOS 41 01/27/2012 1100   ALKPHOS 38 06/16/2010 1101   AST 15 01/27/2012 1100   AST 23 06/16/2010 1101   ALT 10 01/27/2012 1100   BILITOT 0.5 01/27/2012 1100   BILITOT 0.60 06/16/2010 1101       IMPRESSION AND PLAN:  An 79 year old female with: 1. Stage II chronic lymphocytic leukemia status post 6 cycles of fludarabine and Rituxan completed March 16, 2009 without evidence of recurrent disease. 2.  3. History of ischemic colitis.This seems to be pretty stable now.  #3patient will be seen back in 6 months time in followup  Drue Second, MD Medical/Oncology [facility] [phone] (beeper) [phone] (Office)  08/31/2012, 1:46 PM

## 2012-08-31 NOTE — Patient Instructions (Addendum)
Doing well  I will see you back in 6 month

## 2012-09-01 LAB — IGG, IGA, IGM
IgA: 41 mg/dL — ABNORMAL LOW (ref 69–380)
IgG (Immunoglobin G), Serum: 459 mg/dL — ABNORMAL LOW (ref 690–1700)

## 2012-09-01 LAB — DIRECT ANTIGLOBULIN TEST (NOT AT ARMC): DAT (Complement): NEGATIVE

## 2012-10-10 ENCOUNTER — Encounter: Payer: Self-pay | Admitting: *Deleted

## 2012-10-10 ENCOUNTER — Encounter: Payer: Self-pay | Admitting: Cardiology

## 2013-02-18 ENCOUNTER — Other Ambulatory Visit (HOSPITAL_BASED_OUTPATIENT_CLINIC_OR_DEPARTMENT_OTHER): Payer: Medicare Other | Admitting: Lab

## 2013-02-18 ENCOUNTER — Telehealth: Payer: Self-pay | Admitting: *Deleted

## 2013-02-18 ENCOUNTER — Ambulatory Visit (HOSPITAL_BASED_OUTPATIENT_CLINIC_OR_DEPARTMENT_OTHER): Payer: Medicare Other | Admitting: Oncology

## 2013-02-18 ENCOUNTER — Encounter: Payer: Self-pay | Admitting: Oncology

## 2013-02-18 VITALS — BP 132/64 | HR 59 | Temp 97.6°F | Resp 20 | Ht 66.0 in | Wt 117.9 lb

## 2013-02-18 DIAGNOSIS — C9110 Chronic lymphocytic leukemia of B-cell type not having achieved remission: Secondary | ICD-10-CM

## 2013-02-18 LAB — CBC WITH DIFFERENTIAL/PLATELET
Basophils Absolute: 0 10*3/uL (ref 0.0–0.1)
EOS%: 1 % (ref 0.0–7.0)
HCT: 39.7 % (ref 34.8–46.6)
HGB: 13.4 g/dL (ref 11.6–15.9)
MCH: 31.9 pg (ref 25.1–34.0)
MCV: 94.2 fL (ref 79.5–101.0)
MONO%: 9.8 % (ref 0.0–14.0)
NEUT%: 70.5 % (ref 38.4–76.8)
Platelets: 172 10*3/uL (ref 145–400)

## 2013-02-18 LAB — COMPREHENSIVE METABOLIC PANEL (CC13)
ALT: 13 U/L (ref 0–55)
Albumin: 4.1 g/dL (ref 3.5–5.0)
BUN: 24.5 mg/dL (ref 7.0–26.0)
CO2: 25 mEq/L (ref 22–29)
Calcium: 9.3 mg/dL (ref 8.4–10.4)
Chloride: 105 mEq/L (ref 98–109)
Creatinine: 0.9 mg/dL (ref 0.6–1.1)

## 2013-02-18 NOTE — Patient Instructions (Addendum)
Doing well  CBC looks good today  For dizziness consider evaluation by ENT  I will see you back in 6 months

## 2013-02-18 NOTE — Progress Notes (Signed)
  CC: Kathy Pounds, MD   DIAGNOSIS:  79 year old female with chronic lymphocytic leukemia stage II.  PAST THERAPY:  Status post 6 cycles of fludarabine and Rituxan administered from October 20, 2008 through March 16, 2009.  CURRENT THERAPY:  Observation.  INTERVAL HISTORY:  Patient is seen in followup today at 6 months time. Overall she seems to be continuing to do well. She otherwise denies any nausea vomiting fevers chills night sweats headaches shortness of breath chest pains palpitations she has no bleeding she has not noticed any lumps or bumps. She does have some dizziness. Remainder of the 10 point review of systems is negative.  ALLERGIES:  Penicillin and codeine.  CURRENT MEDICATIONS:  Calcium, vitamin D, omega-3.  PHYSICAL EXAMINATION:  General:  The patient is awake, alert in no acute distress.  She appears well.   Vital Signs:   Filed Vitals:   02/18/13 1144  BP: 132/64  Pulse: 59  Temp: 97.6 F (36.4 C)  Resp: 20   HEENT:  EOMI.  PERRLA.  Sclerae anicteric.  No conjunctival pallor.  Oral mucosa is moist.  Neck:  Supple.  Lungs:  Clear bilaterally to auscultation and percussion.  Cardiovascular:  Regular rate rhythm.  No murmurs.  Abdomen:  Soft, nontender, nondistended.  Bowel sounds are present.  No HSM.  Extremities:  No edema.  Neuro:  Patient is alert, oriented, otherwise nonfocal.  LABORATORY DATA:   Lab Results  Component Value Date   WBC 4.4 02/18/2013   HGB 13.4 02/18/2013   HCT 39.7 02/18/2013   MCV 94.2 02/18/2013   PLT 172 02/18/2013     Chemistry      Component Value Date/Time   NA 139 02/18/2013 1129   NA 139 01/27/2012 1100   NA 141 06/16/2010 1101   K 4.7 02/18/2013 1129   K 4.6 01/27/2012 1100   K 4.8* 06/16/2010 1101   CL 105 08/31/2012 1100   CL 104 01/27/2012 1100   CL 103 06/16/2010 1101   CO2 25 02/18/2013 1129   CO2 24 01/27/2012 1100   CO2 30 06/16/2010 1101   BUN 24.5 02/18/2013 1129   BUN 25* 01/27/2012 1100   BUN 25* 06/16/2010 1101   CREATININE 0.9 02/18/2013 1129   CREATININE 0.84 01/27/2012 1100   CREATININE 0.9 06/16/2010 1101      Component Value Date/Time   CALCIUM 9.3 02/18/2013 1129   CALCIUM 8.5 01/27/2012 1100   CALCIUM 9.7 06/16/2010 1101   ALKPHOS 41 02/18/2013 1129   ALKPHOS 41 01/27/2012 1100   ALKPHOS 38 06/16/2010 1101   AST 15 02/18/2013 1129   AST 15 01/27/2012 1100   AST 23 06/16/2010 1101   ALT 13 02/18/2013 1129   ALT 10 01/27/2012 1100   BILITOT 0.64 02/18/2013 1129   BILITOT 0.5 01/27/2012 1100   BILITOT 0.60 06/16/2010 1101       IMPRESSION AND PLAN:  An 79 year old female with: 1. Stage II chronic lymphocytic leukemia status post 6 cycles of fludarabine and Rituxan completed March 16, 2009 without evidence of recurrent disease.  2. History of ischemic colitis.  3. Dizziness: may need an ENT evaluation  4. Left leg heaviness: ? arthritis vs neuropathy  5. I will see her back in 6 months  Drue Second, MD Medical/Oncology [facility] [phone] (beeper) [phone] (Office)  02/18/2013, 5:07 PM

## 2013-02-18 NOTE — Telephone Encounter (Signed)
appts made and printed...td 

## 2013-02-25 ENCOUNTER — Other Ambulatory Visit: Payer: Self-pay | Admitting: Dermatology

## 2013-03-18 ENCOUNTER — Other Ambulatory Visit: Payer: Self-pay | Admitting: Dermatology

## 2013-04-26 ENCOUNTER — Other Ambulatory Visit (HOSPITAL_COMMUNITY): Payer: Self-pay | Admitting: *Deleted

## 2013-04-30 ENCOUNTER — Encounter (HOSPITAL_COMMUNITY): Payer: Medicare Other

## 2013-08-26 ENCOUNTER — Encounter: Payer: Self-pay | Admitting: Oncology

## 2013-08-26 ENCOUNTER — Other Ambulatory Visit (HOSPITAL_BASED_OUTPATIENT_CLINIC_OR_DEPARTMENT_OTHER): Payer: Medicare Other

## 2013-08-26 ENCOUNTER — Ambulatory Visit (HOSPITAL_BASED_OUTPATIENT_CLINIC_OR_DEPARTMENT_OTHER): Payer: Medicare Other | Admitting: Oncology

## 2013-08-26 ENCOUNTER — Telehealth: Payer: Self-pay | Admitting: Oncology

## 2013-08-26 VITALS — BP 129/84 | HR 76 | Temp 97.8°F | Resp 18 | Ht 66.0 in | Wt 113.2 lb

## 2013-08-26 DIAGNOSIS — C9110 Chronic lymphocytic leukemia of B-cell type not having achieved remission: Secondary | ICD-10-CM

## 2013-08-26 LAB — COMPREHENSIVE METABOLIC PANEL (CC13)
ALT: 11 U/L (ref 0–55)
AST: 13 U/L (ref 5–34)
Albumin: 4.1 g/dL (ref 3.5–5.0)
Alkaline Phosphatase: 69 U/L (ref 40–150)
Anion Gap: 11 mEq/L (ref 3–11)
BUN: 19.8 mg/dL (ref 7.0–26.0)
CALCIUM: 10.3 mg/dL (ref 8.4–10.4)
CHLORIDE: 99 meq/L (ref 98–109)
CO2: 28 mEq/L (ref 22–29)
CREATININE: 0.8 mg/dL (ref 0.6–1.1)
Glucose: 105 mg/dl (ref 70–140)
Potassium: 4.3 mEq/L (ref 3.5–5.1)
Sodium: 138 mEq/L (ref 136–145)
Total Bilirubin: 0.65 mg/dL (ref 0.20–1.20)
Total Protein: 7 g/dL (ref 6.4–8.3)

## 2013-08-26 LAB — CBC WITH DIFFERENTIAL/PLATELET
BASO%: 0.4 % (ref 0.0–2.0)
BASOS ABS: 0 10*3/uL (ref 0.0–0.1)
EOS%: 1 % (ref 0.0–7.0)
Eosinophils Absolute: 0.1 10*3/uL (ref 0.0–0.5)
HEMATOCRIT: 42.6 % (ref 34.8–46.6)
HEMOGLOBIN: 14.1 g/dL (ref 11.6–15.9)
LYMPH#: 0.6 10*3/uL — AB (ref 0.9–3.3)
LYMPH%: 6.5 % — ABNORMAL LOW (ref 14.0–49.7)
MCH: 31.1 pg (ref 25.1–34.0)
MCHC: 33 g/dL (ref 31.5–36.0)
MCV: 94.3 fL (ref 79.5–101.0)
MONO#: 0.6 10*3/uL (ref 0.1–0.9)
MONO%: 6.5 % (ref 0.0–14.0)
NEUT#: 8 10*3/uL — ABNORMAL HIGH (ref 1.5–6.5)
NEUT%: 85.6 % — AB (ref 38.4–76.8)
Platelets: 230 10*3/uL (ref 145–400)
RBC: 4.52 10*6/uL (ref 3.70–5.45)
RDW: 13.5 % (ref 11.2–14.5)
WBC: 9.3 10*3/uL (ref 3.9–10.3)

## 2013-08-26 NOTE — Progress Notes (Signed)
CC: Precious Reel, MD   DIAGNOSIS:  80 year old female with chronic lymphocytic leukemia stage II.  PAST THERAPY:  Status post 6 cycles of fludarabine and Rituxan administered from October 20, 2008 through March 16, 2009.  CURRENT THERAPY:  Observation.  INTERVAL HISTORY:  Patient is seen in followup today at 6 months time. Overall she seems to be continuing to do well. She otherwise denies any nausea vomiting fevers chills night sweats headaches shortness of breath chest pains palpitations she has no bleeding she has not noticed any lumps or bumps. She did have the flu for about 2 weeks. She is now recovered from it completely. She did lose about 5 pounds. She is trying again not back. Remainder of the 10 point review of systems is negative.  ALLERGIES:  Penicillin and codeine.  CURRENT MEDICATIONS:  Calcium, vitamin D, omega-3.  PHYSICAL EXAMINATION:  General:  The patient is awake, alert in no acute distress.  She appears well.   Vital Signs:   Filed Vitals:   08/26/13 1144  BP: 129/84  Pulse: 76  Temp: 97.8 F (36.6 C)  Resp: 18   HEENT:  EOMI.  PERRLA.  Sclerae anicteric.  No conjunctival pallor.  Oral mucosa is moist.  Neck:  Supple.  Lungs:  Clear bilaterally to auscultation and percussion.  Cardiovascular:  Regular rate rhythm.  No murmurs.  Abdomen:  Soft, nontender, nondistended.  Bowel sounds are present.  No HSM.  Extremities:  No edema.  Neuro:  Patient is alert, oriented, otherwise nonfocal.  LABORATORY DATA:   Lab Results  Component Value Date   WBC 9.3 08/26/2013   HGB 14.1 08/26/2013   HCT 42.6 08/26/2013   MCV 94.3 08/26/2013   PLT 230 08/26/2013     Chemistry      Component Value Date/Time   NA 139 02/18/2013 1129   NA 139 01/27/2012 1100   NA 141 06/16/2010 1101   K 4.7 02/18/2013 1129   K 4.6 01/27/2012 1100   K 4.8* 06/16/2010 1101   CL 105 08/31/2012 1100   CL 104 01/27/2012 1100   CL 103 06/16/2010 1101   CO2 25 02/18/2013 1129   CO2 24 01/27/2012 1100   CO2 30 06/16/2010 1101   BUN 24.5 02/18/2013 1129   BUN 25* 01/27/2012 1100   BUN 25* 06/16/2010 1101   CREATININE 0.9 02/18/2013 1129   CREATININE 0.84 01/27/2012 1100   CREATININE 0.9 06/16/2010 1101      Component Value Date/Time   CALCIUM 9.3 02/18/2013 1129   CALCIUM 8.5 01/27/2012 1100   CALCIUM 9.7 06/16/2010 1101   ALKPHOS 41 02/18/2013 1129   ALKPHOS 41 01/27/2012 1100   ALKPHOS 38 06/16/2010 1101   AST 15 02/18/2013 1129   AST 15 01/27/2012 1100   AST 23 06/16/2010 1101   ALT 13 02/18/2013 1129   ALT 10 01/27/2012 1100   ALT 21 06/16/2010 1101   BILITOT 0.64 02/18/2013 1129   BILITOT 0.5 01/27/2012 1100   BILITOT 0.60 06/16/2010 1101       IMPRESSION AND PLAN:  An 80 year old female with: 1. Stage II chronic lymphocytic leukemia status post 6 cycles of fludarabine and Rituxan completed March 16, 2009 without evidence of recurrent disease. Her blood count looks terrific white count has recovered very nicely. She has no evidence of recurrent disease.  2. History of ischemic colitis.  3     patient will be seen back in 6 months time for followup and interim lab  The  length of time of the face-to-face encounter was 30    minutes. More than 50% of time was spent counseling and coordination of care.   Marcy Panning, MD Medical/Oncology [facility] [phone] (beeper) [phone] (Office)  08/26/2013, 11:59 AM

## 2013-08-27 LAB — HAPTOGLOBIN: Haptoglobin: 242 mg/dL — ABNORMAL HIGH (ref 45–215)

## 2013-08-27 LAB — IGG, IGA, IGM
IGA: 44 mg/dL — AB (ref 69–380)
IGG (IMMUNOGLOBIN G), SERUM: 474 mg/dL — AB (ref 690–1700)
IGM, SERUM: 14 mg/dL — AB (ref 52–322)

## 2013-09-08 ENCOUNTER — Encounter (HOSPITAL_COMMUNITY): Payer: Self-pay | Admitting: Emergency Medicine

## 2013-09-08 ENCOUNTER — Inpatient Hospital Stay (HOSPITAL_COMMUNITY)
Admission: EM | Admit: 2013-09-08 | Discharge: 2013-09-11 | DRG: 193 | Disposition: A | Discharge status: Home / Self Care | Payer: Medicare Other | Attending: Internal Medicine | Admitting: Internal Medicine

## 2013-09-08 ENCOUNTER — Emergency Department (HOSPITAL_COMMUNITY): Payer: Medicare Other

## 2013-09-08 DIAGNOSIS — K5900 Constipation, unspecified: Secondary | ICD-10-CM

## 2013-09-08 DIAGNOSIS — J189 Pneumonia, unspecified organism: Principal | ICD-10-CM

## 2013-09-08 DIAGNOSIS — K5909 Other constipation: Secondary | ICD-10-CM | POA: Diagnosis present

## 2013-09-08 DIAGNOSIS — E079 Disorder of thyroid, unspecified: Secondary | ICD-10-CM | POA: Diagnosis present

## 2013-09-08 DIAGNOSIS — R627 Adult failure to thrive: Secondary | ICD-10-CM | POA: Diagnosis present

## 2013-09-08 DIAGNOSIS — Z79899 Other long term (current) drug therapy: Secondary | ICD-10-CM

## 2013-09-08 DIAGNOSIS — E43 Unspecified severe protein-calorie malnutrition: Secondary | ICD-10-CM | POA: Diagnosis present

## 2013-09-08 DIAGNOSIS — C9110 Chronic lymphocytic leukemia of B-cell type not having achieved remission: Secondary | ICD-10-CM | POA: Diagnosis present

## 2013-09-08 DIAGNOSIS — Z880 Allergy status to penicillin: Secondary | ICD-10-CM

## 2013-09-08 DIAGNOSIS — E860 Dehydration: Secondary | ICD-10-CM

## 2013-09-08 DIAGNOSIS — E039 Hypothyroidism, unspecified: Secondary | ICD-10-CM | POA: Diagnosis present

## 2013-09-08 DIAGNOSIS — Z681 Body mass index (BMI) 19 or less, adult: Secondary | ICD-10-CM

## 2013-09-08 HISTORY — DX: Disorder of thyroid, unspecified: E07.9

## 2013-09-08 NOTE — ED Notes (Signed)
Patient transported to X-ray 

## 2013-09-08 NOTE — ED Provider Notes (Signed)
CSN: [id number]     Arrival date & time 09/08/13  2326 History   First MD Initiated Contact with Patient 09/08/13 2328     Chief Complaint  Patient presents with  . Abdominal Pain    x 1 week  . Fever    controlled with tylenol   (Consider location/radiation/quality/duration/timing/severity/associated sxs/prior Treatment) HPI Comments: Patient seen by her PCP on Tuesday for same had negative flu swap at that time but symptoms still present.  States she has bee taking tylenol regularly but fever spikes in the evening.  Tonight she has a new symptoms of her lower legs becoming hot and burning. Denies N/V/D, dysuria, constipation, diarrhea.   Patient is a 80 y.o. female presenting with abdominal pain and fever. The history is provided by the patient.  Abdominal Pain Pain location:  Generalized Pain quality: aching   Pain radiates to:  Does not radiate Pain severity:  Moderate Onset quality:  Gradual Duration:  6 days Timing:  Intermittent Progression:  Worsening Chronicity:  New Context: not alcohol use, not diet changes, not eating, not laxative use, not recent sexual activity, not recent travel, not sick contacts, not suspicious food intake and not trauma   Relieved by:  None tried Worsened by:  Nothing tried Ineffective treatments:  None tried Associated symptoms: cough, fever and sore throat   Associated symptoms: no chest pain, no chills, no constipation, no diarrhea, no dysuria, no flatus, no nausea, no shortness of breath and no vomiting   Cough:    Cough characteristics:  Non-productive   Sputum characteristics:  Nondescript   Severity:  Mild   Duration:  6 days   Timing:  Intermittent   Progression:  Unchanged   Chronicity:  New Fever:    Duration:  6 hours   Timing:  Intermittent   Max temp PTA (F):  102   Temp source:  Rectal   Progression:  Unchanged Sore throat:    Severity:  Mild   Onset quality:  Gradual   Duration:  6 days   Timing:  Constant  Progression:  Unchanged Risk factors: no alcohol abuse, no aspirin use, not elderly, has not had multiple surgeries, no NSAID use, not obese, not pregnant and no recent hospitalization   Fever Max temp prior to arrival:  102 Temp source:  Oral Severity:  Moderate Onset quality:  Gradual Timing:  Intermittent Progression:  Unchanged Chronicity:  New Relieved by:  Acetaminophen Worsened by:  Nothing tried Associated symptoms: cough, myalgias and sore throat   Associated symptoms: no chest pain, no chills, no congestion, no diarrhea, no dysuria, no headaches, no nausea, no rash, no rhinorrhea and no vomiting   Cough:    Cough characteristics:  Non-productive   Sputum characteristics:  Clear   Severity:  Mild   Duration:  6 days   Timing:  Intermittent   Progression:  Unchanged   Chronicity:  New Sore throat:    Severity:  Mild   Onset quality:  Gradual   Timing:  Intermittent   Progression:  Unchanged   Past Medical History  Diagnosis Date  . CLL (chronic lymphocytic leukemia) 07/20/2011  . Arthritis   . PVC's (premature ventricular contractions)   . Thyroid disease    Past Surgical History  Procedure Laterality Date  . Abdominal hysterectomy    . Appendectomy     No family history on file. History  Substance Use Topics  . Smoking status: Never Smoker   . Smokeless tobacco: Never Used  .  Alcohol Use: No   OB History   Grav Para Term Preterm Abortions TAB SAB Ect Mult Living                 Review of Systems  Constitutional: Positive for fever. Negative for chills.  HENT: Positive for sore throat. Negative for congestion, rhinorrhea and sinus pressure.   Respiratory: Positive for cough. Negative for shortness of breath and wheezing.   Cardiovascular: Negative for chest pain and leg swelling.  Gastrointestinal: Positive for abdominal pain. Negative for nausea, vomiting, diarrhea, constipation, blood in stool, abdominal distention and flatus.  Genitourinary:  Negative for dysuria.  Musculoskeletal: Positive for myalgias.  Skin: Negative for color change, rash and wound.  Neurological: Negative for dizziness, weakness and headaches.  All other systems reviewed and are negative.    Allergies  Penicillins  Home Medications   Current Outpatient Rx  Name  Route  Sig  Dispense  Refill  . acetaminophen (TYLENOL) 500 MG tablet   Oral   Take 500 mg by mouth every 6 (six) hours as needed for mild pain or headache.         . cholecalciferol (VITAMIN D) 1000 UNITS tablet   Oral   Take 1,000 Units by mouth every morning.         Marland Kitchen levothyroxine (SYNTHROID, LEVOTHROID) 88 MCG tablet   Oral   Take 88 mcg by mouth daily before breakfast.          . OVER THE COUNTER MEDICATION   Oral   Take 1 tablet by mouth every morning. Over the counter Mega Red supplement          BP 151/97  Pulse 98  Temp(Src) 99.1 F (37.3 C) (Oral)  Resp 20  SpO2 92% Physical Exam  Nursing note and vitals reviewed. Constitutional: She is oriented to person, place, and time. She appears well-developed and well-nourished.  HENT:  Head: Normocephalic and atraumatic.  Right Ear: External ear normal.  Left Ear: External ear normal.  Mouth/Throat: Oropharynx is clear and moist.  Eyes: Pupils are equal, round, and reactive to light.  Neck: Normal range of motion.  Cardiovascular: Normal rate and regular rhythm.   Pulmonary/Chest: Effort normal and breath sounds normal. She has no wheezes. She has no rales.  Abdominal: Soft. Bowel sounds are normal. She exhibits no distension. There is no tenderness.  Musculoskeletal: Normal range of motion. She exhibits no edema and no tenderness.  Low legs equal color temperature, no swelling, non-tender   Neurological: She is alert and oriented to person, place, and time.  Skin: Skin is warm. No rash noted. There is pallor.    ED Course  Procedures (including critical care time) Labs Review Labs Reviewed  CBC WITH  DIFFERENTIAL - Abnormal; Notable for the following:    RBC 3.83 (*)    Hemoglobin 11.9 (*)    Neutrophils Relative % 85 (*)    Neutro Abs 8.8 (*)    Lymphocytes Relative 7 (*)    All other components within normal limits  POCT I-STAT, CHEM 8 - Abnormal; Notable for the following:    BUN 32 (*)    Glucose, Bld 116 (*)    All other components within normal limits  URINALYSIS, ROUTINE W REFLEX MICROSCOPIC   Imaging Review Dg Abd Acute W/chest  09/09/2013   CLINICAL DATA:  Upper abdominal pain for several weeks. History of chronic lymphocytic leukemia.  EXAM: ACUTE ABDOMEN SERIES (ABDOMEN 2 VIEW & CHEST 1 VIEW)  COMPARISON:  CT CHEST W/CM dated 08/25/2008; CT PELVIS W/CM dated 08/25/2008; CT CHEST W/CM dated 06/18/2007; DG CHEST 2 VIEW dated 10/05/2006; DG CHEST 1V PORT dated 06/17/2007  FINDINGS: Right IJ power port is present. Aortic arch atherosclerosis. Chronic bronchitic changes are present. There is airspace disease in the inferior right upper lobe and in the right lower lobe which appears new compared to prior chest radiographs and prior chest CT. Findings are suggestive of multifocal pneumonia. There is no free air underneath the hemidiaphragms. The bowel gas pattern appears within normal limits. Large stool burden is present in the right colon. Gaseous distension of colon. No organomegaly. Left total hip arthroplasty. Surgical clips are present in the right groin.  IMPRESSION: 1. Multifocal airspace disease most consistent with multifocal pneumonia. 2. Nonobstructive bowel gas pattern without acute intra-abdominal abnormality.   Electronically Signed   By: Dereck Ligas M.D.   On: 09/09/2013 00:10    EKG Interpretation   None       MDM   1. Community acquired pneumonia   2. Dehydration   3. Constipation    Temporary orders written DR. Virgina Jock will be in the AM for official admission      Garald Balding, NP 09/09/13 0207

## 2013-09-08 NOTE — ED Notes (Signed)
Bed: RF54 Expected date: 09/08/13 Expected time: 10:52 PM Means of arrival: Ambulance Comments: Flu like sx

## 2013-09-08 NOTE — ED Notes (Addendum)
Per [facility]- Pt presents with NAD- Pt reports flu like symptoms since [employer]. Stomach pain x 1 week. Fever 102 controlled with tylenol. stomach pain sharp. EDNP Baker Janus present upon arrival. Denies NV

## 2013-09-09 DIAGNOSIS — J189 Pneumonia, unspecified organism: Secondary | ICD-10-CM | POA: Diagnosis present

## 2013-09-09 LAB — CBC WITH DIFFERENTIAL/PLATELET
Basophils Absolute: 0 10*3/uL (ref 0.0–0.1)
Basophils Relative: 0 % (ref 0–1)
EOS ABS: 0.1 10*3/uL (ref 0.0–0.7)
EOS PCT: 1 % (ref 0–5)
HEMATOCRIT: 36.1 % (ref 36.0–46.0)
Hemoglobin: 11.9 g/dL — ABNORMAL LOW (ref 12.0–15.0)
Lymphocytes Relative: 7 % — ABNORMAL LOW (ref 12–46)
Lymphs Abs: 0.7 10*3/uL (ref 0.7–4.0)
MCH: 31.1 pg (ref 26.0–34.0)
MCHC: 33 g/dL (ref 30.0–36.0)
MCV: 94.3 fL (ref 78.0–100.0)
MONO ABS: 0.8 10*3/uL (ref 0.1–1.0)
Monocytes Relative: 7 % (ref 3–12)
Neutro Abs: 8.8 10*3/uL — ABNORMAL HIGH (ref 1.7–7.7)
Neutrophils Relative %: 85 % — ABNORMAL HIGH (ref 43–77)
PLATELETS: 260 10*3/uL (ref 150–400)
RBC: 3.83 MIL/uL — ABNORMAL LOW (ref 3.87–5.11)
RDW: 13.2 % (ref 11.5–15.5)
WBC: 10.3 10*3/uL (ref 4.0–10.5)

## 2013-09-09 LAB — URINALYSIS, ROUTINE W REFLEX MICROSCOPIC
Bilirubin Urine: NEGATIVE
Glucose, UA: NEGATIVE mg/dL
Hgb urine dipstick: NEGATIVE
Ketones, ur: NEGATIVE mg/dL
Leukocytes, UA: NEGATIVE
NITRITE: NEGATIVE
PROTEIN: NEGATIVE mg/dL
Specific Gravity, Urine: 1.029 (ref 1.005–1.030)
UROBILINOGEN UA: 1 mg/dL (ref 0.0–1.0)
pH: 7 (ref 5.0–8.0)

## 2013-09-09 LAB — POCT I-STAT, CHEM 8
BUN: 32 mg/dL — ABNORMAL HIGH (ref 6–23)
CREATININE: 0.7 mg/dL (ref 0.50–1.10)
Calcium, Ion: 1.14 mmol/L (ref 1.13–1.30)
Chloride: 101 mEq/L (ref 96–112)
Glucose, Bld: 116 mg/dL — ABNORMAL HIGH (ref 70–99)
HCT: 37 % (ref 36.0–46.0)
HEMOGLOBIN: 12.6 g/dL (ref 12.0–15.0)
Potassium: 4.2 mEq/L (ref 3.7–5.3)
SODIUM: 138 meq/L (ref 137–147)
TCO2: 28 mmol/L (ref 0–100)

## 2013-09-09 MED ORDER — ONDANSETRON HCL 4 MG/2ML IJ SOLN
4.0000 mg | Freq: Once | INTRAMUSCULAR | Status: AC
  Administered 2013-09-09: 4 mg via INTRAVENOUS
  Filled 2013-09-09: qty 2

## 2013-09-09 MED ORDER — LEVOTHYROXINE SODIUM 88 MCG PO TABS
88.0000 ug | ORAL_TABLET | Freq: Every day | ORAL | Status: DC
  Administered 2013-09-09 – 2013-09-11 (×3): 88 ug via ORAL
  Filled 2013-09-09 (×5): qty 1

## 2013-09-09 MED ORDER — ACETAMINOPHEN 325 MG PO TABS
650.0000 mg | ORAL_TABLET | Freq: Four times a day (QID) | ORAL | Status: DC | PRN
Start: 2013-09-09 — End: 2013-09-11
  Administered 2013-09-09 – 2013-09-11 (×3): 650 mg via ORAL
  Filled 2013-09-09 (×3): qty 2

## 2013-09-09 MED ORDER — IPRATROPIUM-ALBUTEROL 0.5-2.5 (3) MG/3ML IN SOLN: 3.0000 mL | Freq: Four times a day (QID) | RESPIRATORY_TRACT | Status: DC | PRN

## 2013-09-09 MED ORDER — SODIUM CHLORIDE 0.9 % IV SOLN
INTRAVENOUS | Status: DC
  Administered 2013-09-09: 09:00:00 via INTRAVENOUS

## 2013-09-09 MED ORDER — ACETAMINOPHEN 325 MG PO TABS
650.0000 mg | ORAL_TABLET | Freq: Once | ORAL | Status: AC
  Administered 2013-09-09: 650 mg via ORAL
  Filled 2013-09-09: qty 2

## 2013-09-09 MED ORDER — VITAMIN D3 25 MCG (1000 UNIT) PO TABS
1000.0000 [IU] | ORAL_TABLET | Freq: Every morning | ORAL | Status: DC
  Administered 2013-09-09 – 2013-09-11 (×3): 1000 [IU] via ORAL
  Filled 2013-09-09 (×3): qty 1

## 2013-09-09 MED ORDER — ONDANSETRON HCL 4 MG PO TABS: 4.0000 mg | ORAL_TABLET | Freq: Four times a day (QID) | ORAL | Status: DC | PRN

## 2013-09-09 MED ORDER — LEVOFLOXACIN IN D5W 500 MG/100ML IV SOLN
500.0000 mg | Freq: Once | INTRAVENOUS | Status: AC
  Administered 2013-09-09: 500 mg via INTRAVENOUS
  Filled 2013-09-09: qty 100

## 2013-09-09 MED ORDER — LEVOFLOXACIN IN D5W 500 MG/100ML IV SOLN
500.0000 mg | Freq: Every day | INTRAVENOUS | Status: DC
  Administered 2013-09-09 – 2013-09-10 (×3): 500 mg via INTRAVENOUS
  Filled 2013-09-09 (×3): qty 100

## 2013-09-09 MED ORDER — IPRATROPIUM-ALBUTEROL 0.5-2.5 (3) MG/3ML IN SOLN
3.0000 mL | Freq: Three times a day (TID) | RESPIRATORY_TRACT | Status: DC
  Administered 2013-09-09 (×2): 3 mL via RESPIRATORY_TRACT
  Filled 2013-09-09: qty 3

## 2013-09-09 MED ORDER — ACETAMINOPHEN 650 MG RE SUPP
650.0000 mg | Freq: Four times a day (QID) | RECTAL | Status: DC | PRN
Start: 2013-09-09 — End: 2013-09-10

## 2013-09-09 MED ORDER — GUAIFENESIN-DM 100-10 MG/5ML PO SYRP
5.0000 mL | ORAL_SOLUTION | ORAL | Status: DC | PRN
  Administered 2013-09-09 – 2013-09-11 (×2): 5 mL via ORAL
  Filled 2013-09-09 (×2): qty 10

## 2013-09-09 MED ORDER — ALBUTEROL SULFATE (2.5 MG/3ML) 0.083% IN NEBU: 2.5000 mg | INHALATION_SOLUTION | Freq: Three times a day (TID) | RESPIRATORY_TRACT | Status: DC

## 2013-09-09 MED ORDER — ONDANSETRON HCL 4 MG/2ML IJ SOLN: 4.0000 mg | Freq: Four times a day (QID) | INTRAMUSCULAR | Status: DC | PRN

## 2013-09-09 MED ORDER — ENOXAPARIN SODIUM 40 MG/0.4ML ~~LOC~~ SOLN
40.0000 mg | SUBCUTANEOUS | Status: DC
Start: 2013-09-09 — End: 2013-09-11
  Administered 2013-09-09 – 2013-09-10 (×2): 40 mg via SUBCUTANEOUS
  Filled 2013-09-09 (×3): qty 0.4

## 2013-09-09 MED ORDER — SODIUM CHLORIDE 0.9 % IV SOLN: INTRAVENOUS | Status: DC

## 2013-09-09 MED ORDER — IPRATROPIUM BROMIDE 0.02 % IN SOLN: 0.5000 mg | Freq: Three times a day (TID) | RESPIRATORY_TRACT | Status: DC

## 2013-09-09 MED ORDER — GUAIFENESIN ER 600 MG PO TB12
600.0000 mg | ORAL_TABLET | Freq: Two times a day (BID) | ORAL | Status: DC
  Administered 2013-09-09 – 2013-09-11 (×5): 600 mg via ORAL
  Filled 2013-09-09 (×7): qty 1

## 2013-09-09 NOTE — ED Notes (Addendum)
MD at bedside. 

## 2013-09-09 NOTE — Progress Notes (Signed)
INITIAL NUTRITION ASSESSMENT  DOCUMENTATION CODES Per approved criteria  -Severe malnutrition in the context of chronic illness -Underweight  Pt meets criteria for severe MALNUTRITION in the context of chronic illness as evidenced by PO intake < 75% est nutrition needs for > one month, 8% weight loss in 3 month period.   INTERVENTION: -Recommend addition of MagicCup supplement once daily -Recommend liberalization of diet to Regular to encourage PO intake -Encouraged PO intake -Will continue to monitor  NUTRITION DIAGNOSIS: Inadequate oral intake related to decreased appetite/constipation as evidenced by PO intake <75% for 2 months, unintentional wt loss.   Goal: Pt to meet >/= 90% of their estimated nutrition needs    Monitor:  Diet order, total protein/energy intake, labs, weights  Reason for Assessment: MST/Underweight BMI  80 y.o. female  Admitting Dx: <principal problem not specified>  ASSESSMENT: 76 Female c CLL and o/w healthy presented to my office Viral flu like illness on 09/03/13 and Rxed conservatively. She had negative flu swap and symptoms persisted and worsened. States she has bee taking tylenol regularly but fever spikes in the evening. She presented last night with 102+ fever, legs hot and burning, DeH, Weakness, AFTT and other Sxs.   -Pt reported decreased appetite since end of 07/2013. Had been dx with flu and was bedbound for two weeks.  -Diet recall indicates pt with very minimal intake. Will eat toast in the morning, and have larger meal around 3pm consisting of either soup, meat or pasta dish. Will have small snacks of yogurt or peanut butter. Encouraged pt consume more frequent small meals of high protein/kcal foods -Usual weight around 118-120 lbs.  - Family concerned pt not consuming optimal nutrition. Has encouraged pt to try Ensure/Boost supplement pta, but disliked taste -Was willing to try sample of MagicCup supplement. Pt enjoyed flavor and agreed to  consume as afternoon snack. Family noted that they will likely purchase supplement upon d/c to assist in pt's weight maintenance -Current PO intake approximately 20% of meals. Pt noted that she would likely consume more if had more salt/seasonings available. Will recommend diet liberalization -Pt also w/constipation. Appetite may improve with regular bowel movements. Encouraged fiber and fluid intake  Height: Ht Readings from Last 1 Encounters:  09/09/13 5\' 6"  (1.676 m)    Weight: Wt Readings from Last 1 Encounters:  09/09/13 110 lb 9.6 oz (50.168 kg)    Ideal Body Weight: 130 lbs  % Ideal Body Weight: 85%  Wt Readings from Last 10 Encounters:  09/09/13 110 lb 9.6 oz (50.168 kg)  08/26/13 113 lb 3.2 oz (51.347 kg)  02/18/13 117 lb 14.4 oz (53.479 kg)  08/31/12 121 lb (54.885 kg)  01/27/12 122 lb 9.6 oz (55.611 kg)  07/20/11 125 lb 1.6 oz (56.745 kg)    Usual Body Weight: 120 lbs   % Usual Body Weight: 92%  BMI:  Body mass index is 17.86 kg/(m^2). Underweight  Estimated Nutritional Needs: Kcal: 1500-1700 Protein: 50-60 gram Fluid: 1700 ml/daily  Skin: Excoriated area on sacrum  Diet Order: Cardiac  EDUCATION NEEDS: -No education needs identified at this time   Intake/Output Summary (Last 24 hours) at 09/09/13 1415 Last data filed at 09/09/13 1140  Gross per 24 hour  Intake     60 ml  Output      0 ml  Net     60 ml    Last BM: pta   Labs:   Recent Labs Lab 09/09/13 0024  NA 138  K 4.2  CL 101  BUN 32*  CREATININE 0.70  GLUCOSE 116*    CBG (last 3)  No results found for this basename: GLUCAP,  in the last 72 hours  Scheduled Meds: . cholecalciferol  1,000 Units Oral q morning - 10a  . enoxaparin (LOVENOX) injection  40 mg Subcutaneous Q24H  . guaiFENesin  600 mg Oral BID  . ipratropium-albuterol  3 mL Nebulization TID  . levofloxacin (LEVAQUIN) IV  500 mg Intravenous QHS  . levothyroxine  88 mcg Oral QAC breakfast    Continuous  Infusions: . sodium chloride 40 mL/hr at 09/09/13 9030    Past Medical History  Diagnosis Date  . CLL (chronic lymphocytic leukemia) 07/20/2011  . Arthritis   . PVC's (premature ventricular contractions)   . Thyroid disease     Past Surgical History  Procedure Laterality Date  . Abdominal hysterectomy    . Appendectomy      [place] Clinical Dietitian [phone]

## 2013-09-09 NOTE — H&P (Signed)
Kathy Franklin is an 80 y.o. female.   PCP:   Precious Reel, MD   Chief Complaint:  CAP/PNA/Weakness/AFTT  HPI: 65 Female c CLL and o/w healthy presented to my office Viral flu like illness on 09/03/13 and Rxed conservatively.  She had negative flu swap and symptoms persisted and worsened. States she has bee taking tylenol regularly but fever spikes in the evening. She presented last night with 102+ fever, legs hot and burning, DeH, Weakness, AFTT and other Sxs.  Denies N/V/D, dysuria, constipation, diarrhea.  Some nondescript sharp Ab pain. CXR c/w PNA - Abx started and called for admission + cough sounds wet but no sputum   Past Medical History:  Past Medical History  Diagnosis Date  . CLL (chronic lymphocytic leukemia) 07/20/2011  . Arthritis   . PVC's (premature ventricular contractions)   . Thyroid disease     Past Surgical History  Procedure Laterality Date  . Abdominal hysterectomy    . Appendectomy        Allergies:   Allergies  Allergen Reactions  . Penicillins Rash     Medications: Prior to Admission medications   Medication Sig Start Date End Date Taking? Authorizing Provider  acetaminophen (TYLENOL) 500 MG tablet Take 500 mg by mouth every 6 (six) hours as needed for mild pain or headache.   Yes Historical Provider, MD  cholecalciferol (VITAMIN D) 1000 UNITS tablet Take 1,000 Units by mouth every morning.   Yes Historical Provider, MD  levothyroxine (SYNTHROID, LEVOTHROID) 88 MCG tablet Take 88 mcg by mouth daily before breakfast.    Yes Historical Provider, MD  OVER THE COUNTER MEDICATION Take 1 tablet by mouth every morning. Over the counter Mega Red supplement   Yes Historical Provider, MD     Medications Prior to Admission  Medication Sig Dispense Refill  . acetaminophen (TYLENOL) 500 MG tablet Take 500 mg by mouth every 6 (six) hours as needed for mild pain or headache.      . cholecalciferol (VITAMIN D) 1000 UNITS tablet Take 1,000 Units by mouth every  morning.      Marland Kitchen levothyroxine (SYNTHROID, LEVOTHROID) 88 MCG tablet Take 88 mcg by mouth daily before breakfast.       . OVER THE COUNTER MEDICATION Take 1 tablet by mouth every morning. Over the counter Mega Red supplement         Social History:  reports that she has never smoked. She has never used smokeless tobacco. She reports that she does not drink alcohol or use illicit drugs.  Family History: No family history on file.  Review of Systems:  Review of Systems - full ROS done See HPI. Besides weak frail and ill for 9 days she normally does very well.  Physical Exam:  Blood pressure 110/61, pulse 72, temperature 97.5 F (36.4 C), temperature source Oral, resp. rate 16, height 5\' 6"  (1.676 m), weight 50.168 kg (110 lb 9.6 oz), SpO2 99.00%. Filed Vitals:   09/08/13 2330 09/09/13 0250 09/09/13 0310 09/09/13 0622  BP:  121/76 115/58 110/61  Pulse:  92 84 72  Temp:  97.9 F (36.6 C) 97.6 F (36.4 C) 97.5 F (36.4 C)  TempSrc:  Oral Oral Oral  Resp:  16 16 16   Height:   5\' 6"  (1.676 m)   Weight:   50.168 kg (110 lb 9.6 oz)   SpO2: 92% 96% 95% 99%   General appearance: A and O Head: Normocephalic, without obvious abnormality, atraumatic Eyes: conjunctivae/corneas clear. PERRL, EOM's intact.  Nose: Nares normal. Septum midline. Mucosa normal. No drainage or sinus tenderness. Throat: lips, mucosa, and tongue normal; teeth and gums normal Neck: no adenopathy, no carotid bruit, no JVD and thyroid not enlarged, symmetric, no tenderness/mass/nodules Resp: Mild Rhonchi.  +++Cough Cardio: Reg GI: soft, non-tender; bowel sounds normal; no masses,  no organomegaly Extremities: extremities normal, atraumatic, no cyanosis or edema Pulses: 2+ and symmetric Lymph nodes:no cervical lymphadenopathy Neurologic: Alert and oriented X 3, normal strength and tone but Globally "weak". Normal symmetric reflexes. Legs look fine     Labs on Admission:   Recent Labs  09/09/13 0024  NA 138   K 4.2  CL 101  GLUCOSE 116*  BUN 32*  CREATININE 0.70   No results found for this basename: AST, ALT, ALKPHOS, BILITOT, PROT, ALBUMIN,  in the last 72 hours No results found for this basename: LIPASE, AMYLASE,  in the last 72 hours  Recent Labs  09/09/13 0015 09/09/13 0024  WBC 10.3  --   NEUTROABS 8.8*  --   HGB 11.9* 12.6  HCT 36.1 37.0  MCV 94.3  --   PLT 260  --    No results found for this basename: CKTOTAL, CKMB, CKMBINDEX, TROPONINI,  in the last 72 hours No results found for this basename: INR, PROTIME     LAB RESULT POCT:  Results for orders placed during the hospital encounter of 09/08/13  CBC WITH DIFFERENTIAL      Result Value Range   WBC 10.3  4.0 - 10.5 K/uL   RBC 3.83 (*) 3.87 - 5.11 MIL/uL   Hemoglobin 11.9 (*) 12.0 - 15.0 g/dL   HCT 36.1  36.0 - 46.0 %   MCV 94.3  78.0 - 100.0 fL   MCH 31.1  26.0 - 34.0 pg   MCHC 33.0  30.0 - 36.0 g/dL   RDW 13.2  11.5 - 15.5 %   Platelets 260  150 - 400 K/uL   Neutrophils Relative % 85 (*) 43 - 77 %   Neutro Abs 8.8 (*) 1.7 - 7.7 K/uL   Lymphocytes Relative 7 (*) 12 - 46 %   Lymphs Abs 0.7  0.7 - 4.0 K/uL   Monocytes Relative 7  3 - 12 %   Monocytes Absolute 0.8  0.1 - 1.0 K/uL   Eosinophils Relative 1  0 - 5 %   Eosinophils Absolute 0.1  0.0 - 0.7 K/uL   Basophils Relative 0  0 - 1 %   Basophils Absolute 0.0  0.0 - 0.1 K/uL  URINALYSIS, ROUTINE W REFLEX MICROSCOPIC      Result Value Range   Color, Urine YELLOW  YELLOW   APPearance CLEAR  CLEAR   Specific Gravity, Urine 1.029  1.005 - 1.030   pH 7.0  5.0 - 8.0   Glucose, UA NEGATIVE  NEGATIVE mg/dL   Hgb urine dipstick NEGATIVE  NEGATIVE   Bilirubin Urine NEGATIVE  NEGATIVE   Ketones, ur NEGATIVE  NEGATIVE mg/dL   Protein, ur NEGATIVE  NEGATIVE mg/dL   Urobilinogen, UA 1.0  0.0 - 1.0 mg/dL   Nitrite NEGATIVE  NEGATIVE   Leukocytes, UA NEGATIVE  NEGATIVE  POCT I-STAT, CHEM 8      Result Value Range   Sodium 138  137 - 147 mEq/L   Potassium 4.2  3.7 -  5.3 mEq/L   Chloride 101  96 - 112 mEq/L   BUN 32 (*) 6 - 23 mg/dL   Creatinine, Ser 0.70  0.50 -  1.10 mg/dL   Glucose, Bld 116 (*) 70 - 99 mg/dL   Calcium, Ion 1.14  1.13 - 1.30 mmol/L   TCO2 28  0 - 100 mmol/L   Hemoglobin 12.6  12.0 - 15.0 g/dL   HCT 37.0  36.0 - 46.0 %      Radiological Exams on Admission: Dg Abd Acute W/chest  09/09/2013   CLINICAL DATA:  Upper abdominal pain for several weeks. History of chronic lymphocytic leukemia.  EXAM: ACUTE ABDOMEN SERIES (ABDOMEN 2 VIEW & CHEST 1 VIEW)  COMPARISON:  CT CHEST W/CM dated 08/25/2008; CT PELVIS W/CM dated 08/25/2008; CT CHEST W/CM dated 06/18/2007; DG CHEST 2 VIEW dated 10/05/2006; DG CHEST 1V PORT dated 06/17/2007  FINDINGS: Right IJ power port is present. Aortic arch atherosclerosis. Chronic bronchitic changes are present. There is airspace disease in the inferior right upper lobe and in the right lower lobe which appears new compared to prior chest radiographs and prior chest CT. Findings are suggestive of multifocal pneumonia. There is no free air underneath the hemidiaphragms. The bowel gas pattern appears within normal limits. Large stool burden is present in the right colon. Gaseous distension of colon. No organomegaly. Left total hip arthroplasty. Surgical clips are present in the right groin.  IMPRESSION: 1. Multifocal airspace disease most consistent with multifocal pneumonia. 2. Nonobstructive bowel gas pattern without acute intra-abdominal abnormality.   Electronically Signed   By: Dereck Ligas M.D.   On: 09/09/2013 00:10      No orders found for this or any previous visit.   Assessment/Plan Active Problems:   CAP (community acquired pneumonia)  CAP - Sats are fine.  CXR c/w Multifocal airspace disease most consistent with multifocal pneumonia. 2. Nonobstructive bowel gas pattern without acute intra-abdominal abnormality. Levaquin/Pulm Toilet.  Recheck CXR tomorrow.  DeH - BUN/Cr = 32/0.7.  IVF's.  Recheck BMET in  am.  Constipation - chronic  DVT proph - Lovenox  Hypothyroid - on Synthroid.  CLL stage 2 Status post 6 cycles of fludarabine and Rituxan administered from October 20, 2008 through March 16, 2009 - WBC remains @ 9000.  No evidence of recurrence.  Dr Humphrey Rolls is her oncologist  Hbg 11.8 - monitor.  Left leg heaviness: ? arthritis vs neuropathy.  Chronic Sxs made worse with current illness - Hopefully will feel better c conservative Rx.  Disposition - hopefully 1-2 days.   Orma Cheetham M 09/09/2013, 7:13 AM

## 2013-09-09 NOTE — ED Provider Notes (Signed)
Medical screening examination/treatment/procedure(s) were conducted as a shared visit with non-physician practitioner(s) and myself.  I personally evaluated the patient during the encounter.   80 yo female with intermittent fevers, fatigue.  Seen by pcm but negative flu swab.  Mild abd pain.  Xrays with constipation, CAP.  To be admitted by pcm.  Kalman Drape, MD 09/09/13 [phone]

## 2013-09-09 NOTE — ED Notes (Signed)
Pt refused in and out

## 2013-09-09 NOTE — Progress Notes (Signed)
Patient arrived earlier via stretcher from ED and Dr Dagmar Hait was notified that patient is here.

## 2013-09-09 NOTE — Progress Notes (Signed)
Utilization review completed.  

## 2013-09-10 ENCOUNTER — Inpatient Hospital Stay (HOSPITAL_COMMUNITY): Payer: Medicare Other

## 2013-09-10 DIAGNOSIS — E43 Unspecified severe protein-calorie malnutrition: Secondary | ICD-10-CM | POA: Insufficient documentation

## 2013-09-10 LAB — BASIC METABOLIC PANEL
BUN: 16 mg/dL (ref 6–23)
CHLORIDE: 101 meq/L (ref 96–112)
CO2: 26 meq/L (ref 19–32)
CREATININE: 0.65 mg/dL (ref 0.50–1.10)
Calcium: 8.4 mg/dL (ref 8.4–10.5)
GFR calc Af Amer: >90 mL/min (ref >90)
GFR calc non Af Amer: 79 mL/min — ABNORMAL LOW (ref >90)
Glucose, Bld: 100 mg/dL — ABNORMAL HIGH (ref 70–99)
Potassium: 4.2 mEq/L (ref 3.7–5.3)
Sodium: 138 mEq/L (ref 137–147)

## 2013-09-10 LAB — CBC
HCT: 32.4 % — ABNORMAL LOW (ref 36.0–46.0)
Hemoglobin: 10.6 g/dL — ABNORMAL LOW (ref 12.0–15.0)
MCH: 30.9 pg (ref 26.0–34.0)
MCHC: 32.7 g/dL (ref 30.0–36.0)
MCV: 94.5 fL (ref 78.0–100.0)
Platelets: 237 10*3/uL (ref 150–400)
RBC: 3.43 MIL/uL — ABNORMAL LOW (ref 3.87–5.11)
RDW: 13.3 % (ref 11.5–15.5)
WBC: 6.3 10*3/uL (ref 4.0–10.5)

## 2013-09-10 MED ORDER — SENNOSIDES-DOCUSATE SODIUM 8.6-50 MG PO TABS
1.0000 | ORAL_TABLET | Freq: Two times a day (BID) | ORAL | Status: DC
  Administered 2013-09-10 – 2013-09-11 (×3): 1 via ORAL
  Filled 2013-09-10 (×6): qty 1

## 2013-09-10 MED ORDER — POLYETHYLENE GLYCOL 3350 17 G PO PACK
17.0000 g | PACK | Freq: Every day | ORAL | Status: DC | PRN
  Administered 2013-09-10: 17 g via ORAL
  Filled 2013-09-10: qty 1

## 2013-09-10 NOTE — Care Management Note (Signed)
    Page 1 of 1   09/10/2013     4:21:58 PM   CARE MANAGEMENT NOTE 09/10/2013  Patient:  Kathy Franklin, Kathy Franklin   Account Number:  [id number]  Date Initiated:  09/10/2013  Documentation initiated by:  [facility]  Subjective/Objective Assessment:   80 Y/O F ADMITTED W/COMMUNITY ACQUIRED PNA.     Action/Plan:   FROM HOME.HAS PCP,PHARMACY.   Anticipated DC Date:  09/13/2013   Anticipated DC Plan:  [place]  CM consult      Choice offered to / List presented to:             Status of service:  In process, will continue to follow Medicare Important Message given?   (If response is "NO", the following Medicare IM given date fields will be blank) Date Medicare IM given:   Date Additional Medicare IM given:    Discharge Disposition:    Per UR Regulation:  Reviewed for med. necessity/level of care/duration of stay  If discussed at [place] of Stay Meetings, dates discussed:    Comments:  09/10/13 Sharol Croghan RN,BSN NCM [phone] WOULD RECOMMEND PT CONS.

## 2013-09-10 NOTE — Progress Notes (Signed)
Subjective: F/Up CAP Cough No more fevers. Cough stronger. Voice stronger. She is still some weak but improving  Objective: Vital signs in last 24 hours: Temp:  [97.4 F (36.3 C)-98 F (36.7 C)] 97.7 F (36.5 C) (02/10 0623) Pulse Rate:  [67-84] 67 (02/10 0623) Resp:  [16-20] 20 (02/10 0623) BP: (119-132)/(64-85) 121/85 mmHg (02/10 0623) SpO2:  [95 %] 95 % (02/10 0623) Weight:  [50.44 kg (111 lb 3.2 oz)] 50.44 kg (111 lb 3.2 oz) (02/10 4332) Weight change: 0.272 kg (9.6 oz) Last BM Date: 09/08/13  CBG (last 3)  No results found for this basename: GLUCAP,  in the last 72 hours  Intake/Output from previous day:  Intake/Output Summary (Last 24 hours) at 09/10/13 0731 Last data filed at 09/10/13 0309  Gross per 24 hour  Intake 416.67 ml  Output    400 ml  Net  16.67 ml   02/09 0701 - 02/10 0700 In: 416.7 [P.O.:180; I.V.:236.7] Out: 400 [Urine:400]   Physical Exam  General appearance: A and O  Throat: lips, mucosa, and tongue normal; teeth and gums normal  Neck: no adenopathy, no carotid bruit, no JVD and thyroid not enlarged, symmetric, no tenderness/mass/nodules  Resp: Mild Rhonchi R. Less cough Cough  Cardio: Reg  GI: soft, non-tender; bowel sounds normal; no masses, no organomegaly  Extremities: extremities normal, atraumatic, no cyanosis or edema  Pulses: 2+ and symmetric  Lymph nodes:no cervical lymphadenopathy  Neurologic: Alert and oriented X 3, normal strength and tone but Globally "weak". Normal symmetric reflexes.  Legs look fine     Lab Results:  Recent Labs  09/09/13 0024 09/10/13 0353  NA 138 138  K 4.2 4.2  CL 101 101  CO2  --  26  GLUCOSE 116* 100*  BUN 32* 16  CREATININE 0.70 0.65  CALCIUM  --  8.4    No results found for this basename: AST, ALT, ALKPHOS, BILITOT, PROT, ALBUMIN,  in the last 72 hours   Recent Labs  09/09/13 0015 09/09/13 0024 09/10/13 0353  WBC 10.3  --  6.3  NEUTROABS 8.8*  --   --   HGB 11.9* 12.6 10.6*   HCT 36.1 37.0 32.4*  MCV 94.3  --  94.5  PLT 260  --  237    No results found for this basename: INR,  PROTIME    No results found for this basename: CKTOTAL, CKMB, CKMBINDEX, TROPONINI,  in the last 72 hours  No results found for this basename: TSH, T4TOTAL, FREET3, T3FREE, THYROIDAB,  in the last 72 hours  No results found for this basename: VITAMINB12, FOLATE, FERRITIN, TIBC, IRON, RETICCTPCT,  in the last 72 hours  Micro Results: No results found for this or any previous visit (from the past 240 hour(s)).   Studies/Results: Dg Abd Acute W/chest  09/09/2013   CLINICAL DATA:  Upper abdominal pain for several weeks. History of chronic lymphocytic leukemia.  EXAM: ACUTE ABDOMEN SERIES (ABDOMEN 2 VIEW & CHEST 1 VIEW)  COMPARISON:  CT CHEST W/CM dated 08/25/2008; CT PELVIS W/CM dated 08/25/2008; CT CHEST W/CM dated 06/18/2007; DG CHEST 2 VIEW dated 10/05/2006; DG CHEST 1V PORT dated 06/17/2007  FINDINGS: Right IJ power port is present. Aortic arch atherosclerosis. Chronic bronchitic changes are present. There is airspace disease in the inferior right upper lobe and in the right lower lobe which appears new compared to prior chest radiographs and prior chest CT. Findings are suggestive of multifocal pneumonia. There is no free air underneath the hemidiaphragms. The  bowel gas pattern appears within normal limits. Large stool burden is present in the right colon. Gaseous distension of colon. No organomegaly. Left total hip arthroplasty. Surgical clips are present in the right groin.  IMPRESSION: 1. Multifocal airspace disease most consistent with multifocal pneumonia. 2. Nonobstructive bowel gas pattern without acute intra-abdominal abnormality.   Electronically Signed   By: Dereck Ligas M.D.   On: 09/09/2013 00:10     Medications: Scheduled: . cholecalciferol  1,000 Units Oral q morning - 10a  . enoxaparin (LOVENOX) injection  40 mg Subcutaneous Q24H  . guaiFENesin  600 mg Oral BID  .  levofloxacin (LEVAQUIN) IV  500 mg Intravenous QHS  . levothyroxine  88 mcg Oral QAC breakfast   Continuous: . sodium chloride 40 mL/hr at 09/09/13 4920     Assessment/Plan: Active Problems:   CAP (community acquired pneumonia)   Protein-calorie malnutrition, severe  CAP - Sats remain fine at 95%. CXR c/w Multifocal airspace disease most consistent with multifocal pneumonia. 2. Nonobstructive bowel gas pattern without acute intra-abdominal abnormality. Levaquin/Pulm Toilet. Recheck CXR today.  WBC down to 6.3  DeH - BUN/Cr = 32/0.7 down to 16/0.5 and better post IVF's. D/c IVF. Constipation - chronic  DVT proph - Lovenox  Hypothyroid - on Synthroid.  CLL stage 2 Status post 6 cycles of fludarabine and Rituxan administered from October 20, 2008 through March 16, 2009 - WBC remains fine. No evidence of recurrence. Dr Humphrey Rolls is her oncologist  Hbg 11.9 down to 10.6 post hydration -- monitor.  Left leg heaviness: ? arthritis vs neuropathy. Chronic Sxs made worse with current illness - Hopefully will feel better c conservative Rx.   Disposition - hopefully tomorrow  Severe malnutrition in the context of chronic illness - Seen by Nutrition 09/07/13: -MagicCup supplement once daily  -liberalization of diet to Regular to encourage PO intake  -Encouraged PO intake  -Will continue to monitor    ID -  Anti-infectives   Start     Dose/Rate Route Frequency Ordered Stop   09/09/13 0315  levofloxacin (LEVAQUIN) IVPB 500 mg     500 mg 100 mL/hr over 60 Minutes Intravenous Daily at bedtime 09/09/13 0305     09/09/13 0030  levofloxacin (LEVAQUIN) IVPB 500 mg     500 mg 100 mL/hr over 60 Minutes Intravenous  Once 09/09/13 0022 09/09/13 0256       LOS: 2 days   Laneka Mcgrory M 09/10/2013, 7:31 AM

## 2013-09-11 MED ORDER — LEVOFLOXACIN 500 MG PO TABS
500.0000 mg | ORAL_TABLET | Freq: Every day | ORAL | Status: DC
  Filled 2013-09-11: qty 1

## 2013-09-11 MED ORDER — LEVOFLOXACIN 500 MG PO TABS: 500.0000 mg | ORAL_TABLET | Freq: Every day | ORAL | Status: DC

## 2013-09-11 MED ORDER — GUAIFENESIN-DM 100-10 MG/5ML PO SYRP: 5.0000 mL | ORAL_SOLUTION | ORAL | Status: DC | PRN

## 2013-09-11 MED ORDER — GUAIFENESIN ER 600 MG PO TB12: 600.0000 mg | ORAL_TABLET | Freq: Two times a day (BID) | ORAL | Status: DC | PRN

## 2013-09-11 NOTE — Care Management Note (Signed)
Cm spoke with patient at the bedside concerning discharge planning.Per pt resides home with spouse. Independent prior to admission. No barriers or needs identified.   Venita Lick Breyona Swander,MSN,RN [phone]

## 2013-09-11 NOTE — Progress Notes (Signed)
Patient's d/c instructions given, patient verbalized understanding.Prescriptions handed to the patient.- Sandie Ano RN

## 2013-09-11 NOTE — Progress Notes (Signed)
Patient d/c home,denies SOB. Stable. No c/o pain.Sandie Ano RN

## 2013-09-11 NOTE — Discharge Summary (Signed)
Physician Discharge Summary  DISCHARGE SUMMARY   Patient ID: Kathy Franklin MR#: [id number] DOB/AGE: 1927/07/31 80 y.o.   Attending 75 M  Patient's EVO:JJKKX,FGHW M, MD  Consults: **  Admit date: 09/08/2013 Discharge date: 09/11/2013  Discharge Diagnoses:  Active Problems:   CAP (community acquired pneumonia)   Protein-calorie malnutrition, severe   Patient Active Problem List   Diagnosis Date Noted  . Protein-calorie malnutrition, severe 09/10/2013  . CAP (community acquired pneumonia) 09/09/2013  . CLL (chronic lymphocytic leukemia) 07/20/2011   Past Medical History  Diagnosis Date  . CLL (chronic lymphocytic leukemia) 07/20/2011  . Arthritis   . PVC's (premature ventricular contractions)   . Thyroid disease     Discharged Condition: stable.   Discharge Medications:   Medication List         acetaminophen 500 MG tablet  Commonly known as:  TYLENOL  Take 500 mg by mouth every 6 (six) hours as needed for mild pain or headache.     cholecalciferol 1000 UNITS tablet  Commonly known as:  VITAMIN D  Take 1,000 Units by mouth every morning.     guaiFENesin 600 MG 12 hr tablet  Commonly known as:  MUCINEX  Take 1 tablet (600 mg total) by mouth 2 (two) times daily as needed for cough or to loosen phlegm.     guaiFENesin-dextromethorphan 100-10 MG/5ML syrup  Commonly known as:  ROBITUSSIN DM  Take 5 mLs by mouth every 4 (four) hours as needed for cough.     levofloxacin 500 MG tablet  Commonly known as:  LEVAQUIN  Take 1 tablet (500 mg total) by mouth daily.     levothyroxine 88 MCG tablet  Commonly known as:  SYNTHROID, LEVOTHROID  Take 88 mcg by mouth daily before breakfast.     OVER THE COUNTER MEDICATION  Take 1 tablet by mouth every morning. Over the counter [facility] Procedures: X-ray Chest Pa And Lateral   09/10/2013   CLINICAL DATA:  Pneumonia  EXAM: CHEST  2 VIEW  COMPARISON:  September 08, 2013   FINDINGS: Patchy areas of airspace disease in the right upper lobe and both lower lobes remain stable. There is no new opacity in either lung. There appears to be a degree of underlying emphysema. Heart is mildly enlarged with pulmonary vascular reflecting underlying emphysema. Port-A-Cath tip is in the superior vena cava. No pneumothorax. No adenopathy.  IMPRESSION: Patchy airspace disease bilaterally, stable. No new opacity. Underlying emphysema.   Electronically Signed   By: Lowella Grip M.D.   On: 09/10/2013 07:45   Dg Abd Acute W/chest  09/09/2013   CLINICAL DATA:  Upper abdominal pain for several weeks. History of chronic lymphocytic leukemia.  EXAM: ACUTE ABDOMEN SERIES (ABDOMEN 2 VIEW & CHEST 1 VIEW)  COMPARISON:  CT CHEST W/CM dated 08/25/2008; CT PELVIS W/CM dated 08/25/2008; CT CHEST W/CM dated 06/18/2007; DG CHEST 2 VIEW dated 10/05/2006; DG CHEST 1V PORT dated 06/17/2007  FINDINGS: Right IJ power port is present. Aortic arch atherosclerosis. Chronic bronchitic changes are present. There is airspace disease in the inferior right upper lobe and in the right lower lobe which appears new compared to prior chest radiographs and prior chest CT. Findings are suggestive of multifocal pneumonia. There is no free air underneath the hemidiaphragms. The bowel gas pattern appears within normal limits. Large stool burden is present in the right colon. Gaseous distension of colon. No organomegaly. Left total hip arthroplasty. Surgical clips  are present in the right groin.  IMPRESSION: 1. Multifocal airspace disease most consistent with multifocal pneumonia. 2. Nonobstructive bowel gas pattern without acute intra-abdominal abnormality.   Electronically Signed   By: Dereck Ligas M.D.   On: 09/09/2013 00:10    History of Present Illness: 79 Female c CLL S/P Rx and o/w healthy presented to my office c Viral flu like illness on 09/03/13 and Rxed conservatively. She had negative flu swab and symptoms persisted and  worsened. She had been taking tylenol regularly but fever spikes in the evening. She presented 09/08/13 night with 102+ fever, legs hot and burning, DeH, Weakness, AFTT and other Sxs.  Denies N/V/D, dysuria, constipation, diarrhea. Some nondescript sharp Ab pain.  CXR c/w PNA - Abx started and called for admission  + cough sounds wet but no sputum   Hospital Course:  Admitted c CAP and deH.  Rxed c IV Abx = Levaquin, IVF, and pulm toilet. Sats remained fine at 94-95% and she never needed O2. CXR c/w Multifocal airspace disease most consistent with multifocal pneumonia. 2. Nonobstructive bowel gas pattern without acute intra-abdominal abnormality. Repeat CXR 2/10 showed: Patchy airspace disease bilaterally, stable. No new opacity.  Underlying emphysema.   WBC down to 6.3and not repeated. Some Night sweats but she is ready to go home.  She improved. Cough improved. No more fevers.  Voice stronger.  She is still some weak but improving slowly daily  DeH - BUN/Cr = 32/0.7 down to 16/0.5 and better post IVF's. D/c IVF on 2/10.  Constipation - chronic. Miralax/Senna given in hospital.  BM had on 09/11/13 DVT proph - Lovenox  Hypothyroid - on Synthroid.  CLL stage 2 Status post 6 cycles of fludarabine and Rituxan administered from October 20, 2008 through March 16, 2009 - WBC remains fine. No evidence of recurrence. Dr Humphrey Rolls is her oncologist  Hbg 11.9 down to 10.6 post hydration -- monitor.  Left leg heaviness: ? arthritis vs neuropathy. Chronic Sxs made worse with current illness - Hopefully will feel better c conservative Rx.   Disposition - home today.  Severe malnutrition in the context of chronic illness - Seen by Nutrition 09/09/13:  -MagicCup supplement once daily  -liberalization of diet to Regular to encourage PO intake  -Encouraged PO intake  -Will continue to monitor   Day of Discharge Exam BP 135/70  Pulse 66  Temp(Src) 96 F (35.6 C) (Oral)  Resp 15  Ht 5\' 6"  (1.676 m)  Wt  50.44 kg (111 lb 3.2 oz)  BMI 17.96 kg/m2  SpO2 94%  Physical Exam: General appearance: A and O  Throat: lips, mucosa, and tongue normal; teeth and gums normal  Neck: no adenopathy, no carotid bruit, no JVD and thyroid not enlarged, symmetric, no tenderness/mass/nodules  Resp: Mild Rhonchi R. Less cough Cough  Cardio: Reg  GI: soft, non-tender; bowel sounds normal; no masses, no organomegaly  Extremities: extremities normal, atraumatic, no cyanosis or edema  Pulses: 2+ and symmetric  Lymph nodes:no cervical lymphadenopathy  Neurologic: Alert and oriented X 3, normal strength and tone but Globally "weak". Normal symmetric reflexes.  Legs look fine     Discharge Labs:  Recent Labs  09/09/13 0024 09/10/13 0353  NA 138 138  K 4.2 4.2  CL 101 101  CO2  --  26  GLUCOSE 116* 100*  BUN 32* 16  CREATININE 0.70 0.65  CALCIUM  --  8.4   No results found for this basename: AST, ALT, ALKPHOS, BILITOT, PROT, ALBUMIN,  in the last 72 hours  Recent Labs  09/09/13 0015 09/09/13 0024 09/10/13 0353  WBC 10.3  --  6.3  NEUTROABS 8.8*  --   --   HGB 11.9* 12.6 10.6*  HCT 36.1 37.0 32.4*  MCV 94.3  --  94.5  PLT 260  --  237   No results found for this basename: CKTOTAL, CKMB, CKMBINDEX, TROPONINI,  in the last 72 hours No results found for this basename: TSH, T4TOTAL, FREET3, T3FREE, THYROIDAB,  in the last 72 hours No results found for this basename: VITAMINB12, FOLATE, FERRITIN, TIBC, IRON, RETICCTPCT,  in the last 72 hours No results found for this basename: INR,  PROTIME       Discharge instructions:  Future Appointments Provider Department Dept Phone   02/19/2014 11:00 AM [facility] [phone]   02/19/2014 11:30 AM Deatra Robinson, MD [facility] [phone]      Follow-up Information   Follow up with Precious Reel, MD In 1 week.   Specialty:  Internal Medicine   Contact information:    [facility] [place] [id number] [phone]        Disposition: home  Follow-up Appts: Follow-up with Dr. Virgina Jock at [facility] in 7 days.  Call for appointment.  Condition on Discharge: stable  Tests Needing Follow-up: CBC/CMET  Time spent in discharge (includes decision making & examination of pt): 30 min  Signed: Devondre Guzzetta M 09/11/2013, 7:34 AM

## 2013-09-11 NOTE — Discharge Instructions (Signed)

## 2013-10-14 ENCOUNTER — Other Ambulatory Visit: Payer: Self-pay | Admitting: Internal Medicine

## 2013-10-14 DIAGNOSIS — R911 Solitary pulmonary nodule: Secondary | ICD-10-CM

## 2013-10-16 ENCOUNTER — Ambulatory Visit
Admission: RE | Admit: 2013-10-16 | Discharge: 2013-10-16 | Disposition: A | Discharge status: Home / Self Care | Payer: Medicare Other | Source: Ambulatory Visit | Attending: Internal Medicine | Admitting: Internal Medicine

## 2013-10-16 DIAGNOSIS — R911 Solitary pulmonary nodule: Secondary | ICD-10-CM

## 2013-10-17 ENCOUNTER — Other Ambulatory Visit: Payer: Medicare Other

## 2013-10-22 ENCOUNTER — Ambulatory Visit (INDEPENDENT_AMBULATORY_CARE_PROVIDER_SITE_OTHER): Payer: Medicare Other | Admitting: Internal Medicine

## 2013-10-22 ENCOUNTER — Encounter: Payer: Self-pay | Admitting: Internal Medicine

## 2013-10-22 VITALS — BP 120/66 | HR 73 | Temp 97.9°F | Ht 64.5 in | Wt 110.6 lb

## 2013-10-22 DIAGNOSIS — R918 Other nonspecific abnormal finding of lung field: Secondary | ICD-10-CM

## 2013-10-22 NOTE — Patient Instructions (Signed)
Most likely you have organizing pneumonia (normal response to an acute lung infection) and all we need is serial cxr unless you develop new respiratory symptoms  Please schedule a follow up office visit in 4 weeks, sooner if needed with a cxr

## 2013-10-22 NOTE — Progress Notes (Signed)
Subjective:    Patient ID: Kathy Franklin, female    DOB: [birth year]   MRN: [id number]  HPI  55 yowf never smoker with h/o CLL off all rx x 2006 abruptly ill Christmas 2014 stayed home for 2 weeks with flu like symptoms with fever aches no cough and eventually admitted with dx of pna:  Admit date: 09/08/2013  Discharge date: 09/11/2013  Discharge Diagnoses:  Active Problems:  CAP (community acquired pneumonia)  Protein-calorie malnutrition, severe  Patient Active Problem List    Diagnosis  Date Noted   .  Protein-calorie malnutrition, severe  09/10/2013   .  CAP (community acquired pneumonia)  09/09/2013   .  CLL (chronic lymphocytic leukemia)     History of Present Illness:  40 Female c CLL S/P Rx and o/w healthy presented to my office c Viral flu like illness on 09/03/13 and Rxed conservatively. She had negative flu swab and symptoms persisted and worsened. She had been taking tylenol regularly but fever spikes in the evening. She presented 09/08/13 night with 102+ fever, legs hot and burning, DeH, Weakness, AFTT and other Sxs.  Denies N/V/D, dysuria, constipation, diarrhea. Some nondescript sharp Ab pain.  CXR c/w PNA - Abx started and called for admission  + cough sounds wet but no sputum  Hospital Course:  Admitted c CAP and deH. Rxed c IV Abx = Levaquin, IVF, and pulm toilet. Sats remained fine at 94-95% and she never needed O2. CXR c/w Multifocal airspace disease most consistent with multifocal pneumonia. 2. Nonobstructive bowel gas pattern without acute intra-abdominal abnormality. Repeat CXR 2/10 showed: Patchy airspace disease bilaterally, stable. No new opacity. Underlying emphysema. WBC down to 6.3and not repeated. Some Night sweats but she is ready to go home. D/c on Levaquin    10/22/2013 1st [facility] office visit/ Kathy Franklin  Chief Complaint  Patient presents with  . Pulmonary Consult    Referred per Dr. Shon Baton for eval of abnormal CXR   strength not quite back but  now  able to exercise and denies ever really having much cough or any sputum production, no sweats, appetite baseline. Fever and aches all gone.  Not limited by breathing from desired activities    No obvious other patterns in day to day or daytime variabilty or assoc  cp or chest tightness, subjective wheeze overt sinus or hb symptoms. No unusual exp hx or h/o childhood pna/ asthma or knowledge of premature birth.  Sleeping ok without nocturnal  or early am exacerbation  of respiratory  c/o's or need for noct saba. Also denies any obvious fluctuation of symptoms with weather or environmental changes or other aggravating or alleviating factors except as outlined above   Current Medications, Allergies, Complete Past Medical History, Past Surgical History, Family History, and Social History were reviewed in [employer] record.           .    Review of Systems  Constitutional: Positive for appetite change and unexpected weight change. Negative for fever and chills.  HENT: Negative for congestion, dental problem, ear pain, nosebleeds, postnasal drip, rhinorrhea, sinus pressure, sneezing, sore throat, trouble swallowing and voice change.   Eyes: Negative for visual disturbance.  Respiratory: Negative for cough, choking and shortness of breath.   Cardiovascular: Negative for chest pain and leg swelling.  Gastrointestinal: Negative for vomiting, abdominal pain and diarrhea.  Genitourinary: Negative for difficulty urinating.  Musculoskeletal: Negative for arthralgias.  Skin: Negative for rash.  Neurological: Positive for headaches.  Negative for tremors and syncope.  Hematological: Does not bruise/bleed easily.       Objective:   Physical Exam   Wt Readings from Last 3 Encounters:  10/22/13 110 lb 9.6 oz (50.168 kg)  09/10/13 111 lb 3.2 oz (50.44 kg)  08/26/13 113 lb 3.2 oz (51.347 kg)      HEENT: nl dentition, turbinates, and orophanx. Nl external ear canals  without cough reflex   NECK :  without JVD/Nodes/TM/ nl carotid upstrokes bilaterally   LUNGS: no acc muscle use, clear to A and P bilaterally without cough on insp or exp maneuvers   CV:  RRR  no s3 or murmur or increase in P2, no edema   ABD:  soft and nontender with nl excursion in the supine position. No bruits or organomegaly, bowel sounds nl  MS:  warm without deformities, calf tenderness, cyanosis or clubbing  SKIN: warm and dry without lesions    NEURO:  alert, approp, no deficits    Chest CT 10/16/13 Peribronchial bilateral patchy GG changes  RLL most predominant      Assessment & Plan:

## 2013-10-22 NOTE — Assessment & Plan Note (Signed)
Most likely this is just organizing pna, not active infection, though she certainly is at risk of MAI as per radiology's concerns given her age and CLL - also not we have no baseline cxr in her file over the last sev years so ? Chronicity of these infiltrates and whether they correlate with her flu like illness at Christmas  She is clinically doing great at present so Discussed in detail all the  indications, usual  risks and alternatives  relative to the benefits with patient who agrees to proceed with serial cxr/ f/u in 4 weeks, sooner if needed

## 2013-11-08 ENCOUNTER — Telehealth: Payer: Self-pay | Admitting: Internal Medicine

## 2013-11-08 NOTE — Telephone Encounter (Signed)
Called made pt aware of MW recs. Nothing further needed

## 2013-11-08 NOTE — Telephone Encounter (Signed)
Spoke with pt.  C/o left sided chest pain off and on for past 2 days.  Describes this as an achiness.  Hurts to press on area. Has some tingling in left fingers. Denies n or v, sob, cough, fever.  PT has f/u appt with cxr scheduled for 11/19/13.  Please advise

## 2013-11-08 NOTE — Telephone Encounter (Signed)
If it hurts to press on chest and comes and goes it's probably just a muscle pain / go to ER though if becomes more severe continuous or assoc with sob/ nausea or fever

## 2013-11-19 ENCOUNTER — Ambulatory Visit (INDEPENDENT_AMBULATORY_CARE_PROVIDER_SITE_OTHER): Payer: Medicare Other | Admitting: Internal Medicine

## 2013-11-19 ENCOUNTER — Encounter: Payer: Self-pay | Admitting: Internal Medicine

## 2013-11-19 ENCOUNTER — Ambulatory Visit (INDEPENDENT_AMBULATORY_CARE_PROVIDER_SITE_OTHER)
Admission: RE | Admit: 2013-11-19 | Discharge: 2013-11-19 | Disposition: A | Discharge status: Home / Self Care | Payer: Medicare Other | Source: Ambulatory Visit | Attending: Internal Medicine | Admitting: Internal Medicine

## 2013-11-19 VITALS — BP 122/70 | HR 60 | Temp 97.9°F | Ht 65.25 in | Wt 111.4 lb

## 2013-11-19 DIAGNOSIS — R918 Other nonspecific abnormal finding of lung field: Secondary | ICD-10-CM

## 2013-11-19 NOTE — Patient Instructions (Signed)
Please schedule a follow up office visit in 6 weeks, call sooner if needed with cxr

## 2013-11-19 NOTE — Progress Notes (Signed)
Subjective:    Patient ID: Kathy Franklin, female    DOB: 1927-07-27   MRN: [id number]    Brief patient profile:  54 yowf never smoker with h/o CLL off all rx x 2006 abruptly ill Christmas 2014 stayed home for 2 weeks with flu like symptoms with fever aches no cough and eventually admitted with dx of pna:  Admit date: 09/08/2013  Discharge date: 09/11/2013  Discharge Diagnoses:  Active Problems:  CAP (community acquired pneumonia)  Protein-calorie malnutrition, severe  Patient Active Problem List    Diagnosis  Date Noted   .  Protein-calorie malnutrition, severe  09/10/2013   .  CAP (community acquired pneumonia)  09/09/2013   .  CLL (chronic lymphocytic leukemia)     History of Present Illness:  45 Female c CLL S/P Rx and o/w healthy presented to my office c Viral flu like illness on 09/03/13 and Rxed conservatively. She had negative flu swab and symptoms persisted and worsened. She had been taking tylenol regularly but fever spikes in the evening. She presented 09/08/13 night with 102+ fever, legs hot and burning, DeH, Weakness, AFTT and other Sxs.  Denies N/V/D, dysuria, constipation, diarrhea. Some nondescript sharp Ab pain.  CXR c/w PNA - Abx started and called for admission  + cough sounds wet but no sputum  Hospital Course:  Admitted c CAP and deH. Rxed c IV Abx = Levaquin, IVF, and pulm toilet. Sats remained fine at 94-95% and she never needed O2. CXR c/w Multifocal airspace disease most consistent with multifocal pneumonia. 2. Nonobstructive bowel gas pattern without acute intra-abdominal abnormality. Repeat CXR 2/10 showed: Patchy airspace disease bilaterally, stable. No new opacity. Underlying emphysema. WBC down to 6.3and not repeated. Some Night sweats but she is ready to go home. D/c on Levaquin    10/22/2013 1st [facility] office visit/ Wert  Chief Complaint  Patient presents with  . Pulmonary Consult    Referred per Dr. Shon Baton for eval of abnormal CXR    strength not quite back but now  able to exercise and denies ever really having much cough or any sputum production, no sweats, appetite baseline. Fever and aches all gone.  Not limited by breathing from desired activities   rec Most likely you have organizing pneumonia (normal response to an acute lung infection) and all we need is serial cxr unless you develop new respiratory symptoms    11/19/2013 f/u ov/Wert re: pulmonary infiltrates  Chief Complaint  Patient presents with  . Follow-up    CXR done today- Pain in mid-section lung area.  pain is positional, not pleuritic, mild and variably present, worse with yawning.   No obvious day to day or daytime variabilty or assoc chronic cough or cp or chest tightness, subjective wheeze overt sinus or hb symptoms. No unusual exp hx or h/o childhood pna/ asthma or knowledge of premature birth.  Sleeping ok without nocturnal  or early am exacerbation  of respiratory  c/o's or need for noct saba. Also denies any obvious fluctuation of symptoms with weather or environmental changes or other aggravating or alleviating factors except as outlined above   Current Medications, Allergies, Complete Past Medical History, Past Surgical History, Family History, and Social History were reviewed in [employer] record.  ROS  The following are not active complaints unless bolded sore throat, dysphagia, dental problems, itching, sneezing,  nasal congestion or excess/ purulent secretions, ear ache,   fever, chills, sweats, unintended wt loss, classically  pleuritic  or exertional cp, hemoptysis,  orthopnea pnd or leg swelling, presyncope, palpitations, heartburn, abdominal pain, anorexia, nausea, vomiting, diarrhea  or change in bowel or urinary habits, change in stools or urine, dysuria,hematuria,  rash, arthralgias, visual complaints, headache, numbness weakness or ataxia or problems with walking or coordination,  change in mood/affect or  memory.     .           .           Objective:   Physical Exam  11/19/2013        111 Wt Readings from Last 3 Encounters:  10/22/13 110 lb 9.6 oz (50.168 kg)  09/10/13 111 lb 3.2 oz (50.44 kg)  08/26/13 113 lb 3.2 oz (51.347 kg)      HEENT: nl dentition, turbinates, and orophanx. Nl external ear canals without cough reflex   NECK :  without JVD/Nodes/TM/ nl carotid upstrokes bilaterally   LUNGS: no acc muscle use, clear to A and P bilaterally without cough on insp or exp maneuvers   CV:  RRR  no s3 or murmur or increase in P2, no edema   ABD:  soft and nontender with nl excursion in the supine position. No bruits or organomegaly, bowel sounds nl  MS:  warm without deformities, calf tenderness, cyanosis or clubbing  SKIN: warm and dry without lesions    NEURO:  alert, approp, no deficits    Chest CT 10/16/13 Peribronchial bilateral patchy GG changes  RLL most predominant   CXR  11/19/2013 :  Interval improvement in right upper lobe and right lower lobe  infiltrates.      Assessment & Plan:

## 2013-11-21 NOTE — Assessment & Plan Note (Signed)
-   see CT chest  10/16/13 ? Organizing pna   Marked serial improvement assoc with vague non-pleuritic positional cp > reassured her she is clearly better c/w resolving inflammatory infiltrates ? Organizing pna > f/u final in 6 weeks

## 2013-12-27 ENCOUNTER — Encounter: Payer: Self-pay | Admitting: Internal Medicine

## 2013-12-27 ENCOUNTER — Ambulatory Visit (INDEPENDENT_AMBULATORY_CARE_PROVIDER_SITE_OTHER): Payer: Medicare Other | Admitting: Internal Medicine

## 2013-12-27 ENCOUNTER — Ambulatory Visit (INDEPENDENT_AMBULATORY_CARE_PROVIDER_SITE_OTHER)
Admission: RE | Admit: 2013-12-27 | Discharge: 2013-12-27 | Disposition: A | Discharge status: Home / Self Care | Payer: Medicare Other | Source: Ambulatory Visit | Attending: Internal Medicine | Admitting: Internal Medicine

## 2013-12-27 ENCOUNTER — Other Ambulatory Visit: Payer: Self-pay | Admitting: Dermatology

## 2013-12-27 VITALS — BP 132/70 | HR 61 | Temp 98.1°F | Ht 65.25 in | Wt 110.0 lb

## 2013-12-27 DIAGNOSIS — R918 Other nonspecific abnormal finding of lung field: Secondary | ICD-10-CM

## 2013-12-27 NOTE — Assessment & Plan Note (Signed)
-   see CT chest  10/16/13 ? Organizing pna > clear on cxr  No further pulmonary f/u needed > plans to f/u with Dr Virgina Jock

## 2013-12-27 NOTE — Patient Instructions (Signed)
Pulmonary follow up is as needed        

## 2013-12-27 NOTE — Progress Notes (Signed)
Subjective:    Patient ID: Kathy Franklin, female    DOB: Apr 30, 1927   MRN: [id number]    Brief patient profile:  83 yowf never smoker with h/o CLL off all rx x 2006 abruptly ill Christmas 2014 stayed home for 2 weeks with flu like symptoms with fever aches no cough and eventually admitted with dx of pna:  Admit date: 09/08/2013  Discharge date: 09/11/2013  Discharge Diagnoses:  Active Problems:  CAP (community acquired pneumonia)  Protein-calorie malnutrition, severe  Patient Active Problem List    Diagnosis  Date Noted   .  Protein-calorie malnutrition, severe  09/10/2013   .  CAP (community acquired pneumonia)  09/09/2013   .  CLL (chronic lymphocytic leukemia)     History of Present Illness:  58 Female c CLL S/P Rx and o/w healthy presented to my office c Viral flu like illness on 09/03/13 and Rxed conservatively. She had negative flu swab and symptoms persisted and worsened. She had been taking tylenol regularly but fever spikes in the evening. She presented 09/08/13 night with 102+ fever, legs hot and burning, DeH, Weakness, AFTT and other Sxs.  Denies N/V/D, dysuria, constipation, diarrhea. Some nondescript sharp Ab pain.  CXR c/w PNA - Abx started and called for admission  + cough sounds wet but no sputum  Hospital Course:  Admitted c CAP and deH. Rxed c IV Abx = Levaquin, IVF, and pulm toilet. Sats remained fine at 94-95% and she never needed O2. CXR c/w Multifocal airspace disease most consistent with multifocal pneumonia. 2. Nonobstructive bowel gas pattern without acute intra-abdominal abnormality. Repeat CXR 2/10 showed: Patchy airspace disease bilaterally, stable. No new opacity. Underlying emphysema. WBC down to 6.3and not repeated. Some Night sweats but she is ready to go home. D/c on Levaquin    10/22/2013 1st [facility] office visit/ Kathy Franklin  Chief Complaint  Patient presents with  . Pulmonary Consult    Referred per Dr. Shon Baton for eval of abnormal CXR    strength not quite back but now  able to exercise and denies ever really having much cough or any sputum production, no sweats, appetite baseline. Fever and aches all gone.  Not limited by breathing from desired activities   rec Most likely you have organizing pneumonia (normal response to an acute lung infection) and all we need is serial cxr unless you develop new respiratory symptoms    11/19/2013 f/u ov/Kathy Franklin re: pulmonary infiltrates  Chief Complaint  Patient presents with  . Follow-up    CXR done today- Pain in mid-section lung area.  pain is positional, not pleuritic, mild and variably present, worse with yawning.  rec No change rx   12/27/2013 f/u ov/Kathy Franklin re: cap Chief Complaint  Patient presents with  . Follow-up    Pt states that her CP is unchanged. No new co's today.      No obvious day to day or daytime variabilty or assoc chronic cough or pleuritic/ex cp or chest tightness, subjective wheeze overt sinus or hb symptoms. No unusual exp hx or h/o childhood pna/ asthma or knowledge of premature birth.  Sleeping ok without nocturnal  or early am exacerbation  of respiratory  c/o's or need for noct saba. Also denies any obvious fluctuation of symptoms with weather or environmental changes or other aggravating or alleviating factors except as outlined above   Current Medications, Allergies, Complete Past Medical History, Past Surgical History, Family History, and Social History were reviewed in [employer]  record.  ROS  The following are not active complaints unless bolded sore throat, dysphagia, dental problems, itching, sneezing,  nasal congestion or excess/ purulent secretions, ear ache,   fever, chills, sweats, unintended wt loss,   hemoptysis,  orthopnea pnd or leg swelling, presyncope, palpitations, heartburn, abdominal pain, anorexia, nausea, vomiting, diarrhea  or change in bowel or urinary habits, change in stools or urine, dysuria,hematuria,  rash,  arthralgias, visual complaints, headache, numbness weakness or ataxia or problems with walking or coordination,  change in mood/affect or memory.     .   Objective:   Physical Exam  11/19/2013        111 > 12/27/2013  110  Wt Readings from Last 3 Encounters:  10/22/13 110 lb 9.6 oz (50.168 kg)  09/10/13 111 lb 3.2 oz (50.44 kg)  08/26/13 113 lb 3.2 oz (51.347 kg)      HEENT: nl dentition, turbinates, and orophanx. Nl external ear canals without cough reflex   NECK :  without JVD/Nodes/TM/ nl carotid upstrokes bilaterally   LUNGS: no acc muscle use, clear to A and P bilaterally without cough on insp or exp maneuvers   CV:  RRR  no s3 or murmur or increase in P2, no edema   ABD:  soft and nontender with nl excursion in the supine position. No bruits or organomegaly, bowel sounds nl  MS:  warm without deformities, calf tenderness, cyanosis or clubbing  SKIN: warm and dry without lesions    NEURO:  alert, approp, no deficits    Chest CT 10/16/13 Peribronchial bilateral patchy GG changes  RLL most predominant   CXR  12/27/2013 :  Underlying emphysematous change. No edema or consolidation.       Assessment & Plan:

## 2014-01-27 ENCOUNTER — Telehealth: Payer: Self-pay | Admitting: Hematology and Oncology

## 2014-01-27 NOTE — Telephone Encounter (Signed)
returned call and r/s 7/22 appt to 8/27 for lb/VG. pt has new d/t.

## 2014-02-14 ENCOUNTER — Other Ambulatory Visit: Payer: Self-pay | Admitting: Internal Medicine

## 2014-02-14 DIAGNOSIS — R42 Dizziness and giddiness: Secondary | ICD-10-CM

## 2014-02-17 ENCOUNTER — Ambulatory Visit
Admission: RE | Admit: 2014-02-17 | Discharge: 2014-02-17 | Disposition: A | Discharge status: Home / Self Care | Payer: Medicare Other | Source: Ambulatory Visit | Attending: Internal Medicine | Admitting: Internal Medicine

## 2014-02-17 ENCOUNTER — Other Ambulatory Visit: Payer: Medicare Other

## 2014-02-17 DIAGNOSIS — R42 Dizziness and giddiness: Secondary | ICD-10-CM

## 2014-02-19 ENCOUNTER — Other Ambulatory Visit: Payer: Medicare Other

## 2014-02-19 ENCOUNTER — Ambulatory Visit: Payer: Medicare Other | Admitting: Oncology

## 2014-03-27 ENCOUNTER — Ambulatory Visit: Payer: Medicare Other | Admitting: Hematology and Oncology

## 2014-03-27 ENCOUNTER — Other Ambulatory Visit: Payer: Medicare Other

## 2014-05-01 ENCOUNTER — Telehealth: Payer: Self-pay | Admitting: Hematology and Oncology

## 2014-05-05 ENCOUNTER — Telehealth: Payer: Self-pay | Admitting: Hematology and Oncology

## 2014-05-05 NOTE — Telephone Encounter (Signed)
, °

## 2014-05-19 ENCOUNTER — Ambulatory Visit: Payer: Medicare Other | Admitting: Hematology and Oncology

## 2014-05-19 ENCOUNTER — Other Ambulatory Visit: Payer: Medicare Other

## 2014-05-22 ENCOUNTER — Emergency Department (HOSPITAL_COMMUNITY)
Admission: EM | Admit: 2014-05-22 | Discharge: 2014-05-22 | Disposition: A | Discharge status: Home / Self Care | Payer: Medicare Other | Attending: Emergency Medicine | Admitting: Emergency Medicine

## 2014-05-22 ENCOUNTER — Encounter (HOSPITAL_COMMUNITY): Payer: Self-pay | Admitting: Emergency Medicine

## 2014-05-22 DIAGNOSIS — R040 Epistaxis: Secondary | ICD-10-CM | POA: Insufficient documentation

## 2014-05-22 DIAGNOSIS — Z8679 Personal history of other diseases of the circulatory system: Secondary | ICD-10-CM | POA: Insufficient documentation

## 2014-05-22 DIAGNOSIS — M1990 Unspecified osteoarthritis, unspecified site: Secondary | ICD-10-CM | POA: Insufficient documentation

## 2014-05-22 DIAGNOSIS — E079 Disorder of thyroid, unspecified: Secondary | ICD-10-CM | POA: Diagnosis not present

## 2014-05-22 DIAGNOSIS — Z79899 Other long term (current) drug therapy: Secondary | ICD-10-CM | POA: Diagnosis not present

## 2014-05-22 DIAGNOSIS — Z856 Personal history of leukemia: Secondary | ICD-10-CM | POA: Diagnosis not present

## 2014-05-22 DIAGNOSIS — Z880 Allergy status to penicillin: Secondary | ICD-10-CM | POA: Insufficient documentation

## 2014-05-22 LAB — CBC
HEMATOCRIT: 36.5 % (ref 36.0–46.0)
HEMOGLOBIN: 11.8 g/dL — AB (ref 12.0–15.0)
MCH: 30.4 pg (ref 26.0–34.0)
MCHC: 32.3 g/dL (ref 30.0–36.0)
MCV: 94.1 fL (ref 78.0–100.0)
Platelets: 280 10*3/uL (ref 150–400)
RBC: 3.88 MIL/uL (ref 3.87–5.11)
RDW: 12.7 % (ref 11.5–15.5)
WBC: 5.9 10*3/uL (ref 4.0–10.5)

## 2014-05-22 MED ORDER — OXYMETAZOLINE HCL 0.05 % NA SOLN: 1.0000 | Freq: Four times a day (QID) | NASAL | Status: DC | PRN

## 2014-05-22 NOTE — Discharge Instructions (Signed)

## 2014-05-22 NOTE — ED Notes (Signed)
PT with acute onset nosebleed to L nares.  Pt states she has a kleenex box full of blood at home.  Cotton to L nares presently with no new bleeding.  Hx of chronic lymphocytic leukemia.

## 2014-05-27 ENCOUNTER — Other Ambulatory Visit (HOSPITAL_COMMUNITY): Payer: Medicare Other

## 2014-05-29 ENCOUNTER — Other Ambulatory Visit: Payer: Self-pay | Admitting: *Deleted

## 2014-05-29 DIAGNOSIS — C9110 Chronic lymphocytic leukemia of B-cell type not having achieved remission: Secondary | ICD-10-CM

## 2014-05-29 NOTE — ED Provider Notes (Signed)
CSN: [id number]     Arrival date & time 05/22/14  1221 History   First MD Initiated Contact with Patient 05/22/14 1342     Chief Complaint  Patient presents with  . Epistaxis      HPI Patient with acute onset of bleeding from her left nostril earlier today.  It is now resolved.  She has no complaints at this time.  No recent easy bruising.  She does have a history of chronic lymphocytic leukemia.  No shortness of breath.  No complaints of blood in the back of her throat.   Past Medical History  Diagnosis Date  . CLL (chronic lymphocytic leukemia) 07/20/2011  . Arthritis   . PVC's (premature ventricular contractions)   . Thyroid disease    Past Surgical History  Procedure Laterality Date  . Abdominal hysterectomy    . Appendectomy    . Hernia repair     Family History  Problem Relation Age of Onset  . Asthma Neg Hx   . Emphysema Neg Hx    History  Substance Use Topics  . Smoking status: Never Smoker   . Smokeless tobacco: Never Used  . Alcohol Use: No   OB History   Grav Para Term Preterm Abortions TAB SAB Ect Mult Living                 Review of Systems  All other systems reviewed and are negative.     Allergies  Penicillins  Home Medications   Prior to Admission medications   Medication Sig Start Date End Date Taking? Authorizing Provider  acetaminophen (TYLENOL) 500 MG tablet Take 500 mg by mouth every 6 (six) hours as needed for mild pain or headache.   Yes Historical Provider, MD  cholecalciferol (VITAMIN D) 1000 UNITS tablet Take 1,000 Units by mouth every morning.   Yes Historical Provider, MD  GARLIC PO Take 1 tablet by mouth daily.   Yes Historical Provider, MD  levothyroxine (SYNTHROID, LEVOTHROID) 88 MCG tablet Take 88 mcg by mouth daily before breakfast.    Yes Historical Provider, MD  OVER THE COUNTER MEDICATION Take 1 tablet by mouth every morning. Over the counter Mega Red supplement   Yes Historical Provider, MD  guaiFENesin (MUCINEX) 600  MG 12 hr tablet Take 1 tablet (600 mg total) by mouth 2 (two) times daily as needed for cough or to loosen phlegm. 09/11/13   Precious Reel, MD  oxymetazoline (AFRIN NASAL SPRAY) 0.05 % nasal spray Place 1 spray into both nostrils every 6 (six) hours as needed (nose bleed). 05/22/14   Hoy Morn, MD   BP 147/71  Pulse 59  Temp(Src) 97.8 F (36.6 C) (Oral)  Resp 18  Ht 5\' 6"  (1.676 m)  Wt 111 lb (50.349 kg)  BMI 17.92 kg/m2  SpO2 100% Physical Exam  Nursing note and vitals reviewed. Constitutional: She is oriented to person, place, and time. She appears well-developed and well-nourished.  HENT:  Head: Normocephalic.  Stigmata of recent bleed and left narrow.  No active bleeding at this time  Eyes: EOM are normal.  Neck: Normal range of motion.  Pulmonary/Chest: Effort normal.  Abdominal: She exhibits no distension.  Musculoskeletal: Normal range of motion.  Neurological: She is alert and oriented to person, place, and time.  Psychiatric: She has a normal mood and affect.    ED Course  Procedures (including critical care time) Labs Review Labs Reviewed  CBC - Abnormal; Notable for the following:  Hemoglobin 11.8 (*)    All other components within normal limits    Imaging Review No results found.   EKG Interpretation None      MDM   Final diagnoses:  Left-sided epistaxis    No evidence of bleeding at this time.  Discharged home in good condition.  Home with an ice pack.    Hoy Morn, MD 05/29/14 [phone]

## 2014-05-30 ENCOUNTER — Other Ambulatory Visit (HOSPITAL_BASED_OUTPATIENT_CLINIC_OR_DEPARTMENT_OTHER): Payer: Medicare Other

## 2014-05-30 ENCOUNTER — Telehealth: Payer: Self-pay | Admitting: Hematology and Oncology

## 2014-05-30 ENCOUNTER — Ambulatory Visit (HOSPITAL_BASED_OUTPATIENT_CLINIC_OR_DEPARTMENT_OTHER): Payer: Medicare Other | Admitting: Hematology and Oncology

## 2014-05-30 VITALS — BP 107/58 | HR 72 | Temp 97.4°F | Resp 18 | Ht 66.0 in | Wt 106.4 lb

## 2014-05-30 DIAGNOSIS — C9110 Chronic lymphocytic leukemia of B-cell type not having achieved remission: Secondary | ICD-10-CM

## 2014-05-30 DIAGNOSIS — M25552 Pain in left hip: Secondary | ICD-10-CM

## 2014-05-30 DIAGNOSIS — R630 Anorexia: Secondary | ICD-10-CM

## 2014-05-30 DIAGNOSIS — R634 Abnormal weight loss: Secondary | ICD-10-CM

## 2014-05-30 LAB — CBC WITH DIFFERENTIAL/PLATELET
BASO%: 0.1 % (ref 0.0–2.0)
Basophils Absolute: 0 10*3/uL (ref 0.0–0.1)
EOS%: 0.1 % (ref 0.0–7.0)
Eosinophils Absolute: 0 10*3/uL (ref 0.0–0.5)
HCT: 35 % (ref 34.8–46.6)
HGB: 11.4 g/dL — ABNORMAL LOW (ref 11.6–15.9)
LYMPH%: 6.9 % — AB (ref 14.0–49.7)
MCH: 30.4 pg (ref 25.1–34.0)
MCHC: 32.6 g/dL (ref 31.5–36.0)
MCV: 93.3 fL (ref 79.5–101.0)
MONO#: 0.7 10*3/uL (ref 0.1–0.9)
MONO%: 6.8 % (ref 0.0–14.0)
NEUT#: 8.8 10*3/uL — ABNORMAL HIGH (ref 1.5–6.5)
NEUT%: 86.1 % — ABNORMAL HIGH (ref 38.4–76.8)
PLATELETS: 324 10*3/uL (ref 145–400)
RBC: 3.75 10*6/uL (ref 3.70–5.45)
RDW: 13 % (ref 11.2–14.5)
WBC: 10.2 10*3/uL (ref 3.9–10.3)
lymph#: 0.7 10*3/uL — ABNORMAL LOW (ref 0.9–3.3)

## 2014-05-30 LAB — COMPREHENSIVE METABOLIC PANEL (CC13)
ALK PHOS: 82 U/L (ref 40–150)
ALT: 16 U/L (ref 0–55)
AST: 24 U/L (ref 5–34)
Albumin: 3 g/dL — ABNORMAL LOW (ref 3.5–5.0)
Anion Gap: 10 mEq/L (ref 3–11)
BILIRUBIN TOTAL: 0.5 mg/dL (ref 0.20–1.20)
BUN: 21.7 mg/dL (ref 7.0–26.0)
CO2: 25 mEq/L (ref 22–29)
CREATININE: 0.7 mg/dL (ref 0.6–1.1)
Calcium: 9.2 mg/dL (ref 8.4–10.4)
Chloride: 103 mEq/L (ref 98–109)
Glucose: 100 mg/dl (ref 70–140)
Potassium: 4.4 mEq/L (ref 3.5–5.1)
SODIUM: 138 meq/L (ref 136–145)
TOTAL PROTEIN: 6.3 g/dL — AB (ref 6.4–8.3)

## 2014-05-30 MED ORDER — DEXAMETHASONE 1 MG PO TABS: 1.0000 mg | ORAL_TABLET | Freq: Every day | ORAL | Status: DC

## 2014-05-30 NOTE — Progress Notes (Signed)
Patient Care Team: Precious Reel, MD as PCP - General (Internal Medicine)  DIAGNOSIS: CLL stage II PAST THERAPY: Status post 6 cycles of fludarabine and Rituxan administered from October 20, 2008 through March 16, 2009.  CURRENT THERAPY: Observation  CHIEF COMPLIANT: Recent fall with pain in the left leg  INTERVAL HISTORY: Kathy Franklin is a 80 year old Caucasian with above-mentioned history of CLL who is here for a followup. She reports that she was doing well until recently when she fell and hurt her left leg. She apparently workup did not reveal any fractures. She continues to have a lot of pain and discomfort in the leg. She's not been eating much and her daughter is very concerned about her poor nutrition status of her loss of appetite. She denies any fevers or chills. She is very fatigued and does not have much interest in doing anything.  REVIEW OF SYSTEMS:   Constitutional: Complains of fatigue and neck pain Eyes: Denies blurriness of vision Ears, nose, mouth, throat, and face: Denies mucositis or sore throat Respiratory: Denies cough, dyspnea or wheezes Cardiovascular: Denies palpitation, chest discomfort or lower extremity swelling Gastrointestinal:  Denies nausea, heartburn or change in bowel habits Skin: Denies abnormal skin rashes Lymphatics: Denies new lymphadenopathy or easy bruising Neurological:Denies numbness, tingling or new weaknesses Behavioral/Psych: Mood is stable, no new changes  All other systems were reviewed with the patient and are negative.  I have reviewed the past medical history, past surgical history, social history and family history with the patient and they are unchanged from previous note.  ALLERGIES:  is allergic to penicillins.  MEDICATIONS:  Current Outpatient Prescriptions  Medication Sig Dispense Refill  . acetaminophen (TYLENOL) 500 MG tablet Take 500 mg by mouth every 6 (six) hours as needed for mild pain or headache.      . cholecalciferol  (VITAMIN D) 1000 UNITS tablet Take 1,000 Units by mouth every morning.      Marland Kitchen GARLIC PO Take 1 tablet by mouth daily.      Marland Kitchen guaiFENesin (MUCINEX) 600 MG 12 hr tablet Take 1 tablet (600 mg total) by mouth 2 (two) times daily as needed for cough or to loosen phlegm.      Marland Kitchen levothyroxine (SYNTHROID, LEVOTHROID) 88 MCG tablet Take 88 mcg by mouth daily before breakfast.       . OVER THE COUNTER MEDICATION Take 1 tablet by mouth every morning. Over the counter Mega Red supplement      . oxymetazoline (AFRIN NASAL SPRAY) 0.05 % nasal spray Place 1 spray into both nostrils every 6 (six) hours as needed (nose bleed).  30 mL  0  . dexamethasone (DECADRON) 1 MG tablet Take 1 tablet (1 mg total) by mouth daily.  10 tablet  0   No current facility-administered medications for this visit.    PHYSICAL EXAMINATION: ECOG PERFORMANCE STATUS: 2 - Symptomatic, <50% confined to bed  Filed Vitals:   05/30/14 1206  BP: 107/58  Pulse: 72  Temp: 97.4 F (36.3 C)  Resp: 18   Filed Weights   05/30/14 1206  Weight: 106 lb 6.4 oz (48.263 kg)    GENERAL: Frail and weak SKIN: skin color, texture, turgor are normal, no rashes or significant lesions EYES: normal, Conjunctiva are pink and non-injected, sclera clear OROPHARYNX:no exudate, no erythema and lips, buccal mucosa, and tongue normal  NECK: supple, thyroid normal size, non-tender, without nodularity LYMPH:  no palpable lymphadenopathy in the cervical, axillary or inguinal LUNGS: clear to auscultation and  percussion with normal breathing effort HEART: regular rate & rhythm and no murmurs and no lower extremity edema ABDOMEN:abdomen soft, non-tender and normal bowel sounds Musculoskeletal:no cyanosis of digits and no clubbing  NEURO: alert & oriented x 3 with fluent speech, no focal motor/sensory deficits  LABORATORY DATA:  I have reviewed the data as listed   Chemistry      Component Value Date/Time   NA 138 05/30/2014 1130   NA 138 09/10/2013  0353   NA 141 06/16/2010 1101   K 4.4 05/30/2014 1130   K 4.2 09/10/2013 0353   K 4.8* 06/16/2010 1101   CL 101 09/10/2013 0353   CL 105 08/31/2012 1100   CL 103 06/16/2010 1101   CO2 25 05/30/2014 1130   CO2 26 09/10/2013 0353   CO2 30 06/16/2010 1101   BUN 21.7 05/30/2014 1130   BUN 16 09/10/2013 0353   BUN 25* 06/16/2010 1101   CREATININE 0.7 05/30/2014 1130   CREATININE 0.65 09/10/2013 0353   CREATININE 0.9 06/16/2010 1101      Component Value Date/Time   CALCIUM 9.2 05/30/2014 1130   CALCIUM 8.4 09/10/2013 0353   CALCIUM 9.7 06/16/2010 1101   ALKPHOS 82 05/30/2014 1130   ALKPHOS 41 01/27/2012 1100   ALKPHOS 38 06/16/2010 1101   AST 24 05/30/2014 1130   AST 15 01/27/2012 1100   AST 23 06/16/2010 1101   ALT 16 05/30/2014 1130   ALT 10 01/27/2012 1100   ALT 21 06/16/2010 1101   BILITOT 0.50 05/30/2014 1130   BILITOT 0.5 01/27/2012 1100   BILITOT 0.60 06/16/2010 1101       Lab Results  Component Value Date   WBC 10.2 05/30/2014   HGB 11.4* 05/30/2014   HCT 35.0 05/30/2014   MCV 93.3 05/30/2014   PLT 324 05/30/2014   NEUTROABS 8.8* 05/30/2014    ASSESSMENT & PLAN:  CLL (chronic lymphocytic leukemia) CLL stage II diagnosed and treated with fludarabine and Rituxan x6 cycles in 2010: Patient does not have any clinical evidence of disease relapse. A blood work does not show any lymphocytosis. So we will continue to watch and monitor her once a year with lab work.  Recent fall with left hip pain. Patient underwent x-rays which did not show any fractures. She will plan to take Advil to help with the pain issues. She has of aromatase does not want to take it because of confusion.  Decreased appetite and weight: Patient is becoming malnourished. I prescribed her Decadron for 10 days to kick stop her appetite.   Orders Placed This Encounter  Procedures  . CBC with Differential    Standing Status: Future     Number of Occurrences:      Standing Expiration Date: 05/30/2015  .  Comprehensive metabolic panel (Cmet) - [facility]    Standing Status: Future     Number of Occurrences:      Standing Expiration Date: 05/30/2015  . Lactate dehydrogenase (LDH) - [facility]    Standing Status: Future     Number of Occurrences:      Standing Expiration Date: 05/30/2015   The patient has a good understanding of the overall plan. she agrees with it. She will call with any problems that may develop before her next visit here.  I spent 15 minutes counseling the patient face to face. The total time spent in the appointment was 15 minutes and more than 50% was on counseling and review of test results    Nicholas Lose  K, MD 05/30/2014 12:30 PM

## 2014-05-30 NOTE — Assessment & Plan Note (Signed)
CLL stage II diagnosed and treated with fludarabine and Rituxan x6 cycles in 2010: Patient does not have any clinical evidence of disease relapse. A blood work does not show any lymphocytosis. So we will continue to watch and monitor her once a year with lab work.  Recent fall with left hip pain. Patient underwent x-rays which did not show any fractures. She will plan to take Advil to help with the pain issues. She has of aromatase does not want to take it because of confusion.  Decreased appetite and weight: Patient is becoming malnourished. I prescribed her Decadron for 10 days to kick stop her appetite.

## 2014-05-30 NOTE — Telephone Encounter (Signed)
per pof to sch pt appt-cld & spoke to pt and adv of time & dtae-mailed copy of sch

## 2014-06-06 ENCOUNTER — Ambulatory Visit (HOSPITAL_COMMUNITY)
Admission: RE | Admit: 2014-06-06 | Discharge: 2014-06-06 | Disposition: A | Discharge status: Home / Self Care | Payer: Medicare Other | Source: Ambulatory Visit | Attending: Surgery | Admitting: Surgery

## 2014-06-06 ENCOUNTER — Other Ambulatory Visit (HOSPITAL_COMMUNITY): Payer: Self-pay | Admitting: Internal Medicine

## 2014-06-06 DIAGNOSIS — R55 Syncope and collapse: Secondary | ICD-10-CM | POA: Diagnosis not present

## 2014-07-22 ENCOUNTER — Observation Stay (HOSPITAL_COMMUNITY)
Admission: EM | Admit: 2014-07-22 | Discharge: 2014-07-23 | Disposition: A | Discharge status: Home / Self Care | Payer: Medicare Other | Attending: Internal Medicine | Admitting: Internal Medicine

## 2014-07-22 ENCOUNTER — Encounter (HOSPITAL_COMMUNITY): Payer: Self-pay | Admitting: Emergency Medicine

## 2014-07-22 ENCOUNTER — Emergency Department (HOSPITAL_COMMUNITY): Payer: Medicare Other

## 2014-07-22 DIAGNOSIS — E785 Hyperlipidemia, unspecified: Secondary | ICD-10-CM | POA: Insufficient documentation

## 2014-07-22 DIAGNOSIS — D72819 Decreased white blood cell count, unspecified: Secondary | ICD-10-CM | POA: Diagnosis not present

## 2014-07-22 DIAGNOSIS — E039 Hypothyroidism, unspecified: Secondary | ICD-10-CM | POA: Diagnosis not present

## 2014-07-22 DIAGNOSIS — Z880 Allergy status to penicillin: Secondary | ICD-10-CM | POA: Diagnosis not present

## 2014-07-22 DIAGNOSIS — F419 Anxiety disorder, unspecified: Secondary | ICD-10-CM | POA: Insufficient documentation

## 2014-07-22 DIAGNOSIS — R918 Other nonspecific abnormal finding of lung field: Secondary | ICD-10-CM | POA: Insufficient documentation

## 2014-07-22 DIAGNOSIS — I10 Essential (primary) hypertension: Secondary | ICD-10-CM | POA: Diagnosis not present

## 2014-07-22 DIAGNOSIS — F329 Major depressive disorder, single episode, unspecified: Secondary | ICD-10-CM | POA: Insufficient documentation

## 2014-07-22 DIAGNOSIS — Z856 Personal history of leukemia: Secondary | ICD-10-CM | POA: Insufficient documentation

## 2014-07-22 DIAGNOSIS — R55 Syncope and collapse: Secondary | ICD-10-CM | POA: Insufficient documentation

## 2014-07-22 DIAGNOSIS — R42 Dizziness and giddiness: Secondary | ICD-10-CM | POA: Diagnosis not present

## 2014-07-22 DIAGNOSIS — I493 Ventricular premature depolarization: Secondary | ICD-10-CM | POA: Diagnosis not present

## 2014-07-22 DIAGNOSIS — J439 Emphysema, unspecified: Secondary | ICD-10-CM | POA: Diagnosis not present

## 2014-07-22 DIAGNOSIS — M8580 Other specified disorders of bone density and structure, unspecified site: Secondary | ICD-10-CM | POA: Diagnosis not present

## 2014-07-22 DIAGNOSIS — R079 Chest pain, unspecified: Secondary | ICD-10-CM | POA: Diagnosis not present

## 2014-07-22 DIAGNOSIS — M1990 Unspecified osteoarthritis, unspecified site: Secondary | ICD-10-CM | POA: Diagnosis not present

## 2014-07-22 DIAGNOSIS — E43 Unspecified severe protein-calorie malnutrition: Secondary | ICD-10-CM | POA: Insufficient documentation

## 2014-07-22 DIAGNOSIS — R0789 Other chest pain: Secondary | ICD-10-CM

## 2014-07-22 HISTORY — DX: Major depressive disorder, single episode, unspecified: F32.9

## 2014-07-22 HISTORY — DX: Depression, unspecified: F32.A

## 2014-07-22 LAB — CBC
HCT: 38.1 % (ref 36.0–46.0)
HEMOGLOBIN: 12.1 g/dL (ref 12.0–15.0)
MCH: 29.2 pg (ref 26.0–34.0)
MCHC: 31.8 g/dL (ref 30.0–36.0)
MCV: 92 fL (ref 78.0–100.0)
Platelets: 155 10*3/uL (ref 150–400)
RBC: 4.14 MIL/uL (ref 3.87–5.11)
RDW: 14.3 % (ref 11.5–15.5)
WBC: 5 10*3/uL (ref 4.0–10.5)

## 2014-07-22 LAB — BASIC METABOLIC PANEL
ANION GAP: 9 (ref 5–15)
BUN: 18 mg/dL (ref 6–23)
CHLORIDE: 105 meq/L (ref 96–112)
CO2: 25 mmol/L (ref 19–32)
Calcium: 9.3 mg/dL (ref 8.4–10.5)
Creatinine, Ser: 0.68 mg/dL (ref 0.50–1.10)
GFR calc non Af Amer: 78 mL/min — ABNORMAL LOW (ref >90)
GFR, EST AFRICAN AMERICAN: 90 mL/min — AB (ref >90)
Glucose, Bld: 94 mg/dL (ref 70–99)
Potassium: 4.3 mmol/L (ref 3.5–5.1)
Sodium: 139 mmol/L (ref 135–145)

## 2014-07-22 LAB — TSH: TSH: 0.447 u[IU]/mL (ref 0.350–4.500)

## 2014-07-22 LAB — TROPONIN I

## 2014-07-22 MED ORDER — SODIUM CHLORIDE 0.9 % IV SOLN
INTRAVENOUS | Status: DC
  Administered 2014-07-22 – 2014-07-23 (×2): via INTRAVENOUS

## 2014-07-22 MED ORDER — ACETAMINOPHEN 650 MG RE SUPP: 650.0000 mg | Freq: Four times a day (QID) | RECTAL | Status: DC | PRN

## 2014-07-22 MED ORDER — PREDNISONE 20 MG PO TABS
40.0000 mg | ORAL_TABLET | Freq: Once | ORAL | Status: DC
  Filled 2014-07-22: qty 2

## 2014-07-22 MED ORDER — LORAZEPAM 0.5 MG PO TABS: 0.5000 mg | ORAL_TABLET | Freq: Three times a day (TID) | ORAL | Status: DC | PRN

## 2014-07-22 MED ORDER — MIRTAZAPINE 15 MG PO TBDP
7.5000 mg | ORAL_TABLET | Freq: Every day | ORAL | Status: DC
  Administered 2014-07-22: 7.5 mg via ORAL
  Filled 2014-07-22 (×2): qty 0.5

## 2014-07-22 MED ORDER — LEVOTHYROXINE SODIUM 88 MCG PO TABS
88.0000 ug | ORAL_TABLET | Freq: Every day | ORAL | Status: DC
  Administered 2014-07-23: 88 ug via ORAL
  Filled 2014-07-22 (×2): qty 1

## 2014-07-22 MED ORDER — ASPIRIN 81 MG PO CHEW
324.0000 mg | CHEWABLE_TABLET | Freq: Once | ORAL | Status: AC
  Administered 2014-07-22: 324 mg via ORAL
  Filled 2014-07-22: qty 4

## 2014-07-22 MED ORDER — NITROGLYCERIN 0.4 MG SL SUBL
0.4000 mg | SUBLINGUAL_TABLET | SUBLINGUAL | Status: DC | PRN
  Administered 2014-07-22: 0.4 mg via SUBLINGUAL
  Filled 2014-07-22 (×2): qty 1

## 2014-07-22 MED ORDER — GUAIFENESIN ER 600 MG PO TB12
600.0000 mg | ORAL_TABLET | Freq: Two times a day (BID) | ORAL | Status: DC | PRN
  Filled 2014-07-22: qty 1

## 2014-07-22 MED ORDER — ENOXAPARIN SODIUM 30 MG/0.3ML ~~LOC~~ SOLN
30.0000 mg | SUBCUTANEOUS | Status: DC
  Administered 2014-07-22: 30 mg via SUBCUTANEOUS
  Filled 2014-07-22 (×2): qty 0.3

## 2014-07-22 MED ORDER — OXYMETAZOLINE HCL 0.05 % NA SOLN: 1.0000 | Freq: Four times a day (QID) | NASAL | Status: DC | PRN

## 2014-07-22 MED ORDER — CETYLPYRIDINIUM CHLORIDE 0.05 % MT LIQD
7.0000 mL | Freq: Two times a day (BID) | OROMUCOSAL | Status: DC
  Administered 2014-07-22 – 2014-07-23 (×2): 7 mL via OROMUCOSAL

## 2014-07-22 MED ORDER — POLYETHYLENE GLYCOL 3350 17 G PO PACK
17.0000 g | PACK | Freq: Every day | ORAL | Status: DC | PRN
  Filled 2014-07-22: qty 1

## 2014-07-22 MED ORDER — VITAMIN D3 25 MCG (1000 UNIT) PO TABS
1000.0000 [IU] | ORAL_TABLET | Freq: Every morning | ORAL | Status: DC
  Administered 2014-07-22 – 2014-07-23 (×2): 1000 [IU] via ORAL
  Filled 2014-07-22 (×2): qty 1

## 2014-07-22 MED ORDER — ONDANSETRON HCL 4 MG/2ML IJ SOLN: 4.0000 mg | Freq: Four times a day (QID) | INTRAMUSCULAR | Status: DC | PRN

## 2014-07-22 MED ORDER — ONDANSETRON HCL 4 MG PO TABS: 4.0000 mg | ORAL_TABLET | Freq: Four times a day (QID) | ORAL | Status: DC | PRN

## 2014-07-22 MED ORDER — SODIUM CHLORIDE 0.9 % IJ SOLN: 10.0000 mL | Freq: Two times a day (BID) | INTRAMUSCULAR | Status: DC

## 2014-07-22 MED ORDER — IPRATROPIUM BROMIDE 0.02 % IN SOLN
0.5000 mg | Freq: Four times a day (QID) | RESPIRATORY_TRACT | Status: DC
  Administered 2014-07-22: 0.5 mg via RESPIRATORY_TRACT
  Filled 2014-07-22: qty 2.5

## 2014-07-22 MED ORDER — SODIUM CHLORIDE 0.9 % IV BOLUS (SEPSIS)
500.0000 mL | Freq: Once | INTRAVENOUS | Status: AC
  Administered 2014-07-22: 500 mL via INTRAVENOUS

## 2014-07-22 MED ORDER — BOOST PLUS PO LIQD
237.0000 mL | Freq: Three times a day (TID) | ORAL | Status: DC
  Administered 2014-07-22 – 2014-07-23 (×3): 237 mL via ORAL
  Filled 2014-07-22 (×7): qty 237

## 2014-07-22 MED ORDER — LORAZEPAM 1 MG PO TABS
1.0000 mg | ORAL_TABLET | Freq: Three times a day (TID) | ORAL | Status: DC | PRN
Start: 2014-07-22 — End: 2014-07-22

## 2014-07-22 MED ORDER — CEFUROXIME AXETIL 500 MG PO TABS
500.0000 mg | ORAL_TABLET | Freq: Two times a day (BID) | ORAL | Status: DC
  Administered 2014-07-22 – 2014-07-23 (×2): 500 mg via ORAL
  Filled 2014-07-22 (×4): qty 1

## 2014-07-22 MED ORDER — ACETAMINOPHEN 325 MG PO TABS
650.0000 mg | ORAL_TABLET | Freq: Four times a day (QID) | ORAL | Status: DC | PRN
  Administered 2014-07-23: 650 mg via ORAL
  Filled 2014-07-22: qty 2

## 2014-07-22 MED ORDER — SODIUM CHLORIDE 0.9 % IJ SOLN: 10.0000 mL | INTRAMUSCULAR | Status: DC | PRN

## 2014-07-22 NOTE — H&P (Signed)
Kathy Franklin is an 80 y.o. female.   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  PNA - 09/2013 Admission.  s/p recent syncope admission at [facility] @ 1-2 months ago Carotids done 06/06/14 - RICA clear.  LICA < 00% blockage She has seen me 2x in last 2 months due to anxiety/frustration d/t one daughter and weight loss Mirtazipine 7.5 HS added She has also had a lingering cold/URI and has been on Tussionex/Meclizine/MMW She called this am c left sided CP with radiation to left arm associated c some SOB; + nasal congestion and productive cough with green sputum;+ near syncope this am as well Seen and evaluated in ED and felt not MI as EKG and labs (-).  ? URI - CXR was (-).  I was called to admit for 23 hr Obs and R/Out MI.  Update ECHO  She was orthostatic and tilted and given 1/2 Liter of NS.  ? DeH     Past Medical History:  Past Medical History  Diagnosis Date  . CLL (chronic lymphocytic leukemia) 07/20/2011  . Arthritis   . PVC's (premature ventricular contractions)   . Thyroid disease     Past Surgical History  Procedure Laterality Date  . Abdominal hysterectomy    . Appendectomy    . Hernia repair        Allergies:   Allergies  Allergen Reactions  . Penicillins Rash     Medications: Prior to Admission medications   Medication Sig Start Date End Date Taking? Authorizing Provider  acetaminophen (TYLENOL) 500 MG tablet Take 500 mg by mouth every 6 (six) hours as needed for mild pain or headache.   Yes Historical Provider, MD  AFLURIA PRESERVATIVE FREE 0.5 ML SUSY Inject 0.5 mLs as directed once. 05/05/14  Yes Historical Provider, MD  cholecalciferol (VITAMIN D) 1000 UNITS  tablet Take 1,000 Units by mouth every morning.   Yes Historical Provider, MD  dexamethasone (DECADRON) 1 MG tablet Take 1 tablet (1 mg total) by mouth daily. 05/30/14  Yes Rulon Eisenmenger, MD  GARLIC PO Take 1 tablet by mouth daily.   Yes Historical Provider, MD  levothyroxine (SYNTHROID, LEVOTHROID) 88 MCG tablet Take 88 mcg by mouth daily before breakfast.    Yes Historical Provider, MD  OVER THE COUNTER MEDICATION Take 1 tablet by mouth every morning. Over the counter Mega Red supplement   Yes Historical Provider, MD  guaiFENesin (MUCINEX) 600 MG 12 hr tablet Take 1 tablet (600 mg total) by mouth 2 (two) times daily as needed for cough or to loosen phlegm. Patient not taking: Reported on 07/22/2014 09/11/13   Precious Reel, MD  oxymetazoline [facility] NASAL SPRAY) 0.05 % nasal spray Place 1 spray into both nostrils every 6 (six) hours as needed (nose bleed). Patient not taking: Reported on 07/22/2014 05/22/14   Hoy Morn, MD      (Not in a hospital admission)   Social History:  reports that she has never smoked. She has never used smokeless tobacco. She reports that she does not drink alcohol or use illicit drugs.  Family History: Family History  Problem Relation Age of Onset  . Asthma Neg Hx   . Emphysema Neg Hx  Review of Systems:  Recent wt loss/appetite change. No fever.  + cough, chest tightness and shortness of breath.  All other organ systems reviewed and (-)    Physical Exam:  Blood pressure 127/62, pulse 55, temperature 97.5 F (36.4 C), temperature source Oral, resp. rate 14, height 5\' 5"  (1.651 m), weight 48.535 kg (107 lb), SpO2 96 %. Filed Vitals:   07/22/14 1115 07/22/14 1130 07/22/14 1145 07/22/14 1200  BP: 125/85 125/68 120/66 127/62  Pulse: 67 59 60 55  Temp:      TempSrc:      Resp: 41 15 16 14   Height:      Weight:      SpO2: 95% 96% 96% 96%   General appearance: a and o Head: Normocephalic, without obvious abnormality, atraumatic Eyes:  conjunctivae/corneas clear. PERRL, EOM's intact.  Nose: Nares normal. Septum midline. Mucosa normal. No drainage or sinus tenderness. Throat: lips, mucosa, and tongue normal; teeth and gums normal Neck: no adenopathy, no carotid bruit, no JVD and thyroid not enlarged, symmetric, no tenderness/mass/nodules Resp: CTA but cough/sputum - thick and has to spit it up into emesis basin. Cardio: Reg.  (-)m PAC R Chest GI: soft, non-tender; bowel sounds normal; no masses,  no organomegaly Extremities: extremities normal, atraumatic, no cyanosis or edema Pulses: 2+ and symmetric Lymph nodes: no cervical lymphadenopathy Neurologic: Alert and oriented X 3, normal strength and tone. Normal symmetric reflexes.     Labs on Admission:   Recent Labs  07/22/14 1046  NA 139  K 4.3  CL 105  CO2 25  GLUCOSE 94  BUN 18  CREATININE 0.68  CALCIUM 9.3   No results for input(s): AST, ALT, ALKPHOS, BILITOT, PROT, ALBUMIN in the last 72 hours. No results for input(s): LIPASE, AMYLASE in the last 72 hours.  Recent Labs  07/22/14 1046  WBC 5.0  HGB 12.1  HCT 38.1  MCV 92.0  PLT 155    Recent Labs  07/22/14 1046  TROPONINI <0.03   No results found for: INR, PROTIME   LAB RESULT POCT:  Results for orders placed or performed during the hospital encounter of 37/62/83  Basic metabolic panel  Result Value Ref Range   Sodium 139 135 - 145 mmol/L   Potassium 4.3 3.5 - 5.1 mmol/L   Chloride 105 96 - 112 mEq/L   CO2 25 19 - 32 mmol/L   Glucose, Bld 94 70 - 99 mg/dL   BUN 18 6 - 23 mg/dL   Creatinine, Ser 0.68 0.50 - 1.10 mg/dL   Calcium 9.3 8.4 - 10.5 mg/dL   GFR calc non Af Amer 78 (L) >90 mL/min   GFR calc Af Amer 90 (L) >90 mL/min   Anion gap 9 5 - 15  CBC  Result Value Ref Range   WBC 5.0 4.0 - 10.5 K/uL   RBC 4.14 3.87 - 5.11 MIL/uL   Hemoglobin 12.1 12.0 - 15.0 g/dL   HCT 38.1 36.0 - 46.0 %   MCV 92.0 78.0 - 100.0 fL   MCH 29.2 26.0 - 34.0 pg   MCHC 31.8 30.0 - 36.0 g/dL   RDW  14.3 11.5 - 15.5 %   Platelets 155 150 - 400 K/uL  Troponin I (q 6hr x 3)  Result Value Ref Range   Troponin I <0.03 <0.031 ng/mL      Radiological Exams on Admission: Dg Chest 2 View  07/22/2014   CLINICAL DATA:  Cough for 2 days.  Chest tightness.  Left arm  pain  EXAM: CHEST  2 VIEW  COMPARISON:  Dec 27, 2013  FINDINGS: There is a degree of underlying emphysematous change. There is no appreciable edema or consolidation. The heart size is upper normal with pulmonary vascularity reflecting the underlying emphysema. Port-A-Cath tip is in the superior cava. No pneumothorax. No adenopathy. No bone lesions. There is atherosclerotic calcification in the aorta.  IMPRESSION: Underlying emphysema. No edema or consolidation. No appreciable change from prior study.   Electronically Signed   By: Lowella Grip M.D.   On: 07/22/2014 11:38      Orders placed or performed during the hospital encounter of 07/22/14  . EKG 12-Lead  . EKG 12-Lead  . ED EKG (<59mins upon arrival to the ED)  . ED EKG (<56mins upon arrival to the ED)     Assessment/Plan Active Problems:   Chest pain  Chest pain.  EKG (-) = NSR - PVC.  Admit for serial Enzymes - R/Out MI  Cough and recurrent URI s/p recent Abx and steroids.  Will treat again.  NO PNA on CXR.  Consider CT chest if needed.  CLL - S/P Rx - stable and well  ANXIETY - Has not really started Remeron. I convinced her to try it here. Frustrated and upset over daughter and her behavior.   Worried @ [employer] operational [employer] and his upcoming operations and memory issues.  All the worry is causing weight loss and some somatic Sxs. Does not take ativan often because she feels like she has 'cotton in her head'  considering Megace   Syncope - Syncope 05/24/2014. [facility] - CCT (-), Nml UA, Nml EKG, Nml CXR .  Update ECHO and get Cortisol. She had some Orthostasis and had it again here.  S/P IVF and bolus.  Carotids eval were fine recently.  Passed out d/t malnutrition.   Current Sxs seem anxiety and malnutrition as well Needs to eat and gain weight.   Malnutrition -  Boost.  Nutrition consult weight: 106.25 (07/15/2014 11:34:48 AM) weight: 105 (06/06/2014 3:41:16 PM)  weight: 111 (03/25/2014 3:09:53 PM)  weight: 112 (09/19/2013 2:41:09 PM)  105 - 132.  She does not eat much.  Some anorexia - needs to eat  get wt back to 120. Push Protein  Seen by Nutrition 09/09/13: Severe malnutrition in the context of chronic illness  -Encouraged PO intake  -Will continue to monitor  Push Boost/Ensure.   HYPOTHYROIDISM  On Synthroid 88 Mcg Tabs (Levothyroxine sodium) ..... One po daily  Labs Reviewed:  TSH: 0.49 (03/18/2014)  Chol: 216 (03/18/2014) LDL: 136 (03/18/2014)  Check levels  HYPERTENSION - off BP meds B/P: 136/70  Sxs of orthostasis.  BMET is fine.   FATIGUE Push nourishment  One B12 shot given a few weeks ago    Complete Medication List:  1) Mirtazapine 7.5 Mg Oral Tabs (Mirtazapine) .... One po hs  2) Tussionex Pennkinetic Er 10-8 Mg/40ml Oral Lqcr (Hydrocod polst-chlorphen polst) .... 5cc po bid prn cough  3) Meclizine Hcl 25 Mg Oral Tabs (Meclizine hcl) .Marland Kitchen.. 1 po tid  4) First-dukes Mouthwash Susp (Diphenhyd-hydrocort-nystatin) .... Swish and spit or swallow 1 teaspoon every 4-6 hours  5) Fioricet 50-325-40 Mg Tabs (Butalbital-apap-caffeine) .... Take one tablet by mouth every 6 hours as needed for headache  6) Calcium + D Tabs (Calcium-vitamin d tabs) .... Take one tablet by mouth twice daily  7) Tylenol 325 Mg Tabs (Acetaminophen) .... As needed  8) Vitamin D 2000 Unit Tabs (Cholecalciferol) .Marland Kitchen.. 1 tab daily  9) Lorazepam 0.5 Mg Oral Tabs (Lorazepam) .... One half to one po bid prn  10) Polyethylene Glycol 3350 Powd (Polyethylene glycol 3350) .... Mix 17 grams in 8 ounces of water and drink once daily as needed  11) Reclast 5 Mg/145ml Soln (Zoledronic acid) .... Infuse 5mg  iv once a year (march)  12) Synthroid 88 Mcg Tabs (Levothyroxine  sodium) .... One po daily  Comments: 2/22 as scheduled.  ]  ]  Medication Requests:  Requested Medication: B12 Injection   Analysa Nutting M 07/22/2014, 12:31 PM

## 2014-07-22 NOTE — ED Provider Notes (Signed)
CSN: [id number]     Arrival date & time 07/22/14  0941 History   First MD Initiated Contact with Patient 07/22/14 [phone]     Chief Complaint  Patient presents with  . Chest Pain    HPI  Kathy Franklin is an 80 year old female who presents with chest pain that has persisted intermittently over the past 7 days. This morning, she reports getting up from eating breakfast when she first felt dizzy. She then noted chest pain localized to her chest that extended to her left arm and hand which she describes as "heaviness." She cannot describe what makes it better or worse. She does report having a cold over this past week associated with a productive cough and mild dyspnea. Only home meds are Synthroid. She denies any recent illness, sick contacts, abdominal pain, nausea, vomiting, diarrhea.    Past Medical History  Diagnosis Date  . CLL (chronic lymphocytic leukemia) 07/20/2011  . Arthritis   . PVC's (premature ventricular contractions)   . Thyroid disease    Past Surgical History  Procedure Laterality Date  . Abdominal hysterectomy    . Appendectomy    . Hernia repair     Family History  Problem Relation Age of Onset  . Asthma Neg Hx   . Emphysema Neg Hx    History  Substance Use Topics  . Smoking status: Never Smoker   . Smokeless tobacco: Never Used  . Alcohol Use: No   OB History    No data available     Review of Systems  Constitutional: Positive for appetite change. Negative for fever.  Respiratory: Positive for cough, chest tightness and shortness of breath.   Cardiovascular: Positive for chest pain.  Gastrointestinal: Negative for nausea, vomiting, abdominal pain and diarrhea.      Allergies  Penicillins  Home Medications   Prior to Admission medications   Medication Sig Start Date End Date Taking? Authorizing Provider  acetaminophen (TYLENOL) 500 MG tablet Take 500 mg by mouth every 6 (six) hours as needed for mild pain or headache.   Yes Historical Provider, MD   AFLURIA PRESERVATIVE FREE 0.5 ML SUSY Inject 0.5 mLs as directed once. 05/05/14  Yes Historical Provider, MD  cholecalciferol (VITAMIN D) 1000 UNITS tablet Take 1,000 Units by mouth every morning.   Yes Historical Provider, MD  dexamethasone (DECADRON) 1 MG tablet Take 1 tablet (1 mg total) by mouth daily. 05/30/14  Yes Rulon Eisenmenger, MD  GARLIC PO Take 1 tablet by mouth daily.   Yes Historical Provider, MD  levothyroxine (SYNTHROID, LEVOTHROID) 88 MCG tablet Take 88 mcg by mouth daily before breakfast.    Yes Historical Provider, MD  OVER THE COUNTER MEDICATION Take 1 tablet by mouth every morning. Over the counter Mega Red supplement   Yes Historical Provider, MD  guaiFENesin (MUCINEX) 600 MG 12 hr tablet Take 1 tablet (600 mg total) by mouth 2 (two) times daily as needed for cough or to loosen phlegm. Patient not taking: Reported on 07/22/2014 09/11/13   Precious Reel, MD  oxymetazoline [facility] NASAL SPRAY) 0.05 % nasal spray Place 1 spray into both nostrils every 6 (six) hours as needed (nose bleed). Patient not taking: Reported on 07/22/2014 05/22/14   Hoy Morn, MD   BP 127/62 mmHg  Pulse 55  Temp(Src) 97.5 F (36.4 C) (Oral)  Resp 14  Ht 5\' 5"  (1.651 m)  Wt 107 lb (48.535 kg)  BMI 17.81 kg/m2  SpO2 96% Physical Exam  Constitutional:  She is oriented to person, place, and time. She appears well-developed and well-nourished. No distress.  HENT:  Head: Normocephalic and atraumatic.  Mouth/Throat: Oropharynx is clear and moist. No oropharyngeal exudate.  Eyes: Conjunctivae are normal. Pupils are equal, round, and reactive to light.  Cardiovascular: Normal rate and regular rhythm.  Exam reveals no gallop and no friction rub.   No murmur heard. Pulmonary/Chest: Effort normal and breath sounds normal. No respiratory distress. She has no wheezes. She has no rales. She exhibits no tenderness.  Abdominal: Soft. Bowel sounds are normal. She exhibits no distension. There is no tenderness.  There is no rebound.  Neurological: She is alert and oriented to person, place, and time. No cranial nerve deficit.  Skin: She is not diaphoretic.    ED Course  Procedures (including critical care time) Labs Review Labs Reviewed  BASIC METABOLIC PANEL - Abnormal; Notable for the following:    GFR calc non Af Amer 78 (*)    GFR calc Af Amer 90 (*)    All other components within normal limits  CBC  TROPONIN I  TROPONIN I  TROPONIN I    Imaging Review Dg Chest 2 View  07/22/2014   CLINICAL DATA:  Cough for 2 days.  Chest tightness.  Left arm pain  EXAM: CHEST  2 VIEW  COMPARISON:  Dec 27, 2013  FINDINGS: There is a degree of underlying emphysematous change. There is no appreciable edema or consolidation. The heart size is upper normal with pulmonary vascularity reflecting the underlying emphysema. Port-A-Cath tip is in the superior cava. No pneumothorax. No adenopathy. No bone lesions. There is atherosclerotic calcification in the aorta.  IMPRESSION: Underlying emphysema. No edema or consolidation. No appreciable change from prior study.   Electronically Signed   By: Lowella Grip M.D.   On: 07/22/2014 11:38     EKG Interpretation   Date/Time:  Tuesday July 22 2014 09:45:20 EST Ventricular Rate:  84 PR Interval:  164 QRS Duration: 68 QT Interval:  360 QTC Calculation: 425 R Axis:   36 Text Interpretation:  Sinus rhythm with occasional Premature ventricular  complexes Poor R wave progression Left ventricular hypertrophy with  repolarization abnormality Cannot rule out Septal infarct , age  undetermined Abnormal ECG Confirmed by RAY MD, DANIELLE [phone]) on  07/22/2014 9:54:22 AM      MDM   Final diagnoses:  Chest tightness    Possibly acute bronchitis or PNA but cannot rule out ACS given what she has described. Will order CBC, BMET, troponin x 3, CXR.   1058AM: Orthostatic hypotension confirmed by checking orthostatic vital signs. Will give 500cc NS bolus.    1223PM: Troponin and BMET otherwise unremarkable. Given her symptoms, ACS is concerning. Spoke with Dr. Virgina Jock and confirmed admission.   Charlott Rakes, MD 07/22/14 [place] Ray, MD 07/28/14 [phone]

## 2014-07-22 NOTE — ED Notes (Signed)
Patient transported to X-ray 

## 2014-07-22 NOTE — ED Notes (Signed)
MD at bedside. 

## 2014-07-22 NOTE — Progress Notes (Signed)
INITIAL NUTRITION ASSESSMENT  DOCUMENTATION CODES Per approved criteria  -Severe malnutrition in the context of chronic illness   INTERVENTION: 1.  Supplements; Boost TID 2.  General healthful diet; encourage intake of foods and beverages as able.  RD to follow and assess for nutritional adequacy.    NUTRITION DIAGNOSIS: Inadequate oral intake related to poor appetite, stress as evidenced by pt report, malnutrition.   Monitor:  1.  Food/Beverage; pt meeting >/=90% estimated needs with tolerance. 2.  Wt/wt change; monitor trends  Reason for Assessment: consult  80 y.o. female  Admitting Dx: near-syncope  ASSESSMENT: Pt admitted with near-syncopal event.  RD consulted for possible malnutrition.   RD met with patient who denies problems with intake, weight, or nutrition status. Patient states that Oct 2014 she fell and hurt her left side which resulted in hospitalization and recovery.  Patient states she lost a few pounds at that time, and the past year since that time has been stressful.  Patient states that friends and family tell her she needs to eat more and gain weight, however she does not see a problem with her weight.  Patient states that her usual weight is 118 lbs.  She feels "good" at 105 lbs.  Her preferred weight is 108 lbs, however she has not been able to achieve this at home.  She was drinking Boost after injury in October 2014, however stopped after about 3 weeks.   Her usual intake is: Breakfast (630 AM): 1.5c coffee with creamer and either toast or cereal with bluberries Lunch (1300):  Meat and vegetable or baked potato.   Dinner:  None, not hungry Snacks:  Occasional fruit in the evenings. Beverages: no intake of water or other liquids after taking morning medications and coffee in the morning.   Nutrition Focused Physical Exam: Subcutaneous Fat:  Orbital Region: moderate wasting Upper Arm Region: severe wasting Thoracic and Lumbar Region: severe  wasting  Muscle:  Temple Region: severe wasting Clavicle Bone Region: severe wasting Clavicle and Acromion Bone Region: severe wasting Scapular Bone Region: severe wasting Dorsal Hand: severe wasting Patellar Region: severe wasting Anterior Thigh Region: severe wasting Posterior Calf Region: severe wasting  Edema: none present  RD addressed nutrition-related concerns of healthcare team by reviewing findings from nutrition-focused physical exam and diet recall with patient.  Discussed changes she has experienced over the past year and ways she can feel changes in her overall health.  Discussed the relationship of nutrition in these changes- specifically muscle weakness, fatigue, inability to exercise as she wants, dizziness with standing (she states this has been happening "for a while").    Discussed how patient reportedly feels better when she gains 1-2 lbs, however then she quickly loses it (stress, illness, family issues, etc..) and the need to reach an appropriate and stable weight.  Recommended to patient that we set a goal of 110 lbs.  Patient was initially reluctant stating that she felt this goal was not achievable, however through discussion and additional goal setting was agreeable.   Goal intake was set as regular meals- her current intake at breakfast and lunch with the addition of the following every day: Boost TID Switch to whole milk for cereal and snacks Liquids (preferrably containing calories) throughout the day  Pt was agreeable to plan.   We did not discuss the addition of remeron of Megace to her medication regimen, however both of these medications may assist in stimulating her appetite.   Pt meets criteria for severe MALNUTRITION in  the context of chronic illness as evidenced by 15% weight loss in >1 year, intake insufficient to meet 75% of estimated needs, and severe muscle and fat wasting identified on physical exam.  Height: Ht Readings from Last 1 Encounters:   07/22/14 '5\' 5"'  (1.651 m)    Weight: Wt Readings from Last 1 Encounters:  07/22/14 105 lb 6.1 oz (47.8 kg)    Ideal Body Weight: 125 lbs  % Ideal Body Weight: 84%  Wt Readings from Last 10 Encounters:  07/22/14 105 lb 6.1 oz (47.8 kg)  05/30/14 106 lb 6.4 oz (48.263 kg)  05/22/14 111 lb (50.349 kg)  12/27/13 110 lb (49.896 kg)  11/19/13 111 lb 6.4 oz (50.531 kg)  10/22/13 110 lb 9.6 oz (50.168 kg)  09/10/13 111 lb 3.2 oz (50.44 kg)  08/26/13 113 lb 3.2 oz (51.347 kg)  02/18/13 117 lb 14.4 oz (53.479 kg)  08/31/12 121 lb (54.885 kg)    Usual Body Weight: 118 lbs per pt  % Usual Body Weight: 88%  BMI:  Body mass index is 17.54 kg/(m^2).  Estimated Nutritional Needs: Kcal: 1350-1500 Protein: 50-60g Fluid: ~1.5 L/day  Skin: intact  Diet Order: Diet regular  EDUCATION NEEDS: -Education needs addressed   Intake/Output Summary (Last 24 hours) at 07/22/14 1511 Last data filed at 07/22/14 1359  Gross per 24 hour  Intake    120 ml  Output    200 ml  Net    -80 ml    Last BM: 12/22  Labs:   Recent Labs Lab 07/22/14 1046  NA 139  K 4.3  CL 105  CO2 25  BUN 18  CREATININE 0.68  CALCIUM 9.3  GLUCOSE 94    CBG (last 3)  No results for input(s): GLUCAP in the last 72 hours.  Scheduled Meds: . cefUROXime  500 mg Oral BID WC  . cholecalciferol  1,000 Units Oral q morning - 10a  . enoxaparin (LOVENOX) injection  30 mg Subcutaneous Q24H  . ipratropium  0.5 mg Nebulization Q6H  . lactose free nutrition  237 mL Oral TID WC  . [START ON 07/23/2014] levothyroxine  88 mcg Oral QAC breakfast  . mirtazapine  7.5 mg Oral QHS  . [START ON 07/23/2014] predniSONE  40 mg Oral Once  . sodium chloride  10-40 mL Intracatheter Q12H    Continuous Infusions: . sodium chloride      Past Medical History  Diagnosis Date  . CLL (chronic lymphocytic leukemia) 07/20/2011  . Arthritis   . PVC's (premature ventricular contractions)   . Thyroid disease     Past  Surgical History  Procedure Laterality Date  . Abdominal hysterectomy    . Appendectomy    . Hernia repair      Brynda Greathouse, MS RD LDN Clinical Inpatient Dietitian Weekend/After hours pager: [phone]

## 2014-07-22 NOTE — ED Notes (Signed)
Pt c/o left sided CP with radiation to left arm starting this am with some SOB; pt sts nasal congestion and productive cough with green sputum; pt sts near syncope this am as well

## 2014-07-22 NOTE — ED Notes (Signed)
Patient returned back from xray and placed back on monitor.

## 2014-07-23 DIAGNOSIS — I359 Nonrheumatic aortic valve disorder, unspecified: Secondary | ICD-10-CM

## 2014-07-23 LAB — COMPREHENSIVE METABOLIC PANEL
ALT: 13 U/L (ref 0–35)
AST: 19 U/L (ref 0–37)
Albumin: 3.3 g/dL — ABNORMAL LOW (ref 3.5–5.2)
Alkaline Phosphatase: 64 U/L (ref 39–117)
Anion gap: 7 (ref 5–15)
BILIRUBIN TOTAL: 0.7 mg/dL (ref 0.3–1.2)
BUN: 14 mg/dL (ref 6–23)
CALCIUM: 8.8 mg/dL (ref 8.4–10.5)
CO2: 29 mmol/L (ref 19–32)
CREATININE: 0.83 mg/dL (ref 0.50–1.10)
Chloride: 105 mEq/L (ref 96–112)
GFR calc Af Amer: 72 mL/min — ABNORMAL LOW (ref >90)
GFR, EST NON AFRICAN AMERICAN: 63 mL/min — AB (ref >90)
GLUCOSE: 96 mg/dL (ref 70–99)
Potassium: 4.6 mmol/L (ref 3.5–5.1)
Sodium: 141 mmol/L (ref 135–145)
Total Protein: 5.9 g/dL — ABNORMAL LOW (ref 6.0–8.3)

## 2014-07-23 LAB — CBC
HCT: 37.3 % (ref 36.0–46.0)
Hemoglobin: 11.5 g/dL — ABNORMAL LOW (ref 12.0–15.0)
MCH: 28.6 pg (ref 26.0–34.0)
MCHC: 30.8 g/dL (ref 30.0–36.0)
MCV: 92.8 fL (ref 78.0–100.0)
PLATELETS: 146 10*3/uL — AB (ref 150–400)
RBC: 4.02 MIL/uL (ref 3.87–5.11)
RDW: 14.2 % (ref 11.5–15.5)
WBC: 3.9 10*3/uL — AB (ref 4.0–10.5)

## 2014-07-23 LAB — CORTISOL: Cortisol, Plasma: 5.6 ug/dL

## 2014-07-23 LAB — TROPONIN I: Troponin I: <0.03 ng/mL (ref <0.031)

## 2014-07-23 MED ORDER — PHENOL 1.4 % MT LIQD
1.0000 | OROMUCOSAL | Status: DC | PRN
  Filled 2014-07-23: qty 177

## 2014-07-23 MED ORDER — IPRATROPIUM-ALBUTEROL 0.5-2.5 (3) MG/3ML IN SOLN: 3.0000 mL | RESPIRATORY_TRACT | Status: DC | PRN

## 2014-07-23 MED ORDER — DEXAMETHASONE 2 MG PO TABS
2.0000 mg | ORAL_TABLET | Freq: Every day | ORAL | Status: DC
  Administered 2014-07-23: 2 mg via ORAL
  Filled 2014-07-23: qty 1

## 2014-07-23 MED ORDER — MIRTAZAPINE 7.5 MG PO TABS: 7.5000 mg | ORAL_TABLET | Freq: Every day | ORAL | Status: DC

## 2014-07-23 MED ORDER — IPRATROPIUM BROMIDE 0.02 % IN SOLN
0.5000 mg | Freq: Three times a day (TID) | RESPIRATORY_TRACT | Status: DC
  Filled 2014-07-23: qty 2.5

## 2014-07-23 MED ORDER — LORAZEPAM 0.5 MG PO TABS: 0.5000 mg | ORAL_TABLET | Freq: Two times a day (BID) | ORAL | Status: DC | PRN

## 2014-07-23 MED ORDER — DM-GUAIFENESIN ER 30-600 MG PO TB12
1.0000 | ORAL_TABLET | Freq: Two times a day (BID) | ORAL | Status: DC | PRN
  Filled 2014-07-23: qty 1

## 2014-07-23 MED ORDER — DEXAMETHASONE 2 MG PO TABS: ORAL_TABLET | ORAL | Status: DC

## 2014-07-23 MED ORDER — BOOST PLUS PO LIQD: 237.0000 mL | Freq: Three times a day (TID) | ORAL | Status: DC

## 2014-07-23 MED ORDER — POLYETHYLENE GLYCOL 3350 17 G PO PACK: 17.0000 g | PACK | Freq: Every day | ORAL | Status: DC | PRN

## 2014-07-23 MED ORDER — CEFUROXIME AXETIL 500 MG PO TABS: 500.0000 mg | ORAL_TABLET | Freq: Two times a day (BID) | ORAL | Status: DC

## 2014-07-23 NOTE — Progress Notes (Signed)
Patient ambulated on RA 350 feet. Patient tolerated well, returned to bed. Will continue to monitor closely. Glade Nurse, RN

## 2014-07-23 NOTE — Progress Notes (Signed)
UR completed 

## 2014-07-23 NOTE — Progress Notes (Signed)
Subjective: Admitted 12/22 c CCP, Presyncope, L arm Radiculopathy, continued Bronchitis, Anxiety and AFTT + HA and ST No current Dizzy Just had Blood Draw. Less sputum - she thinks the Nebs have helped   Objective: Vital signs in last 24 hours: Temp:  [97.5 F (36.4 C)-98.7 F (37.1 C)] 98.3 F (36.8 C) (12/23 0456) Pulse Rate:  [55-99] 62 (12/23 0456) Resp:  [14-41] 18 (12/23 0456) BP: (120-166)/(57-85) 143/83 mmHg (12/23 0456) SpO2:  [94 %-100 %] 97 % (12/23 0456) Weight:  [47.8 kg (105 lb 6.1 oz)-48.535 kg (107 lb)] 47.8 kg (105 lb 6.1 oz) (12/22 1348) Weight change:  Last BM Date: 07/22/14  CBG (last 3)  No results for input(s): GLUCAP in the last 72 hours.  Intake/Output from previous day:  Intake/Output Summary (Last 24 hours) at 07/23/14 0651 Last data filed at 07/23/14 0430  Gross per 24 hour  Intake    240 ml  Output    400 ml  Net   -160 ml   12/22 0701 - 12/23 0700 In: 240 [P.O.:240] Out: 400 [Urine:400]   Physical Exam  General appearance: a and o Eyes: conjunctivae/corneas clear. PERRL, EOM's intact.  Nose: Nares normal. Septum midline. Mucosa normal. No drainage or sinus tenderness. Throat: lips, mucosa, and tongue normal; teeth and gums normal Neck: no adenopathy, no carotid bruit, no JVD and thyroid not enlarged, symmetric, no tenderness/mass/nodules Resp: CTA but mild cough/sputum Cardio: Reg. (-) m PAC R Chest GI: soft, non-tender; bowel sounds normal; no masses, no organomegaly Extremities: extremities normal, atraumatic, no cyanosis or edema Pulses: 2+ and symmetric Lymph nodes: no cervical lymphadenopathy Neurologic: Alert and oriented X 3, normal strength and tone. Normal symmetric reflexes.   Lab Results:  Recent Labs  07/22/14 1046  NA 139  K 4.3  CL 105  CO2 25  GLUCOSE 94  BUN 18  CREATININE 0.68  CALCIUM 9.3    No results for input(s): AST, ALT, ALKPHOS, BILITOT, PROT, ALBUMIN in the last 72 hours.   Recent Labs  07/22/14 1046  WBC 5.0  HGB 12.1  HCT 38.1  MCV 92.0  PLT 155    No results found for: INR, PROTIME   Recent Labs  07/22/14 1046 07/22/14 1614 07/22/14 2223  TROPONINI <0.03 <0.03 <0.03     Recent Labs  07/22/14 1430  TSH 0.447    No results for input(s): VITAMINB12, FOLATE, FERRITIN, TIBC, IRON, RETICCTPCT in the last 72 hours.  Micro Results: No results found for this or any previous visit (from the past 240 hour(s)).   Studies/Results: Dg Chest 2 View  07/22/2014   CLINICAL DATA:  Cough for 2 days.  Chest tightness.  Left arm pain  EXAM: CHEST  2 VIEW  COMPARISON:  Dec 27, 2013  FINDINGS: There is a degree of underlying emphysematous change. There is no appreciable edema or consolidation. The heart size is upper normal with pulmonary vascularity reflecting the underlying emphysema. Port-A-Cath tip is in the superior cava. No pneumothorax. No adenopathy. No bone lesions. There is atherosclerotic calcification in the aorta.  IMPRESSION: Underlying emphysema. No edema or consolidation. No appreciable change from prior study.   Electronically Signed   By: Lowella Grip M.D.   On: 07/22/2014 11:38     Medications: Scheduled: . antiseptic oral rinse  7 mL Mouth Rinse BID  . cefUROXime  500 mg Oral BID WC  . cholecalciferol  1,000 Units Oral q morning - 10a  . enoxaparin (LOVENOX) injection  30 mg Subcutaneous  Q24H  . ipratropium  0.5 mg Nebulization TID  . lactose free nutrition  237 mL Oral TID WC  . levothyroxine  88 mcg Oral QAC breakfast  . mirtazapine  7.5 mg Oral QHS  . predniSONE  40 mg Oral Once  . sodium chloride  10-40 mL Intracatheter Q12H   Continuous: . sodium chloride 40 mL/hr at 07/23/14 9024     Assessment/Plan: Active Problems:   Chest pain   Chest pain. EKG (-) = NSR - PVC. S/P 3(-) Trop I's.  CP seems Non-cardiac.  Will hold on stress Testing at monitor/Talk about as outpt.  Cough and recurrent URI s/p recent Abx and steroids.  Will treat again. NO PNA on CXR. Consider CT chest if needed.  CLL - S/P Rx - stable and well  ANXIETY - continue Remeron.  Frustrated and upset over daughter and her behavior.  Worried @ [employer] and his upcoming operations and memory issues.  All the worry is causing weight loss and some somatic Sxs. Does not take ativan often because she feels like she has 'cotton in her head'  considering Megace   Syncope - Syncope 05/24/2014. [place] ED - CCT (-), Nml UA, Nml EKG, Nml CXR . Update ECHO and get Cortisol. She had some Orthostasis and had it again here. S/P IVF and bolus.  Recent carotid eval were fine recently.  Passed out d/t malnutrition.  Current Sxs seem anxiety and malnutrition as well Needs to eat and gain weight.  Cortisol 07/22/14 was only 5.6 - await am morning cortisol to help c possible adrenal insuff?? Short Prednisone to be started - considering changing to Decadron - in case Cotrysin stim testing is needed  Malnutrition - Boost. Nutrition consult weight: 106.25 (07/15/2014 11:34:48 AM) weight: 105 (06/06/2014 3:41:16 PM)  weight: 111 (03/25/2014 3:09:53 PM)  weight: 112 (09/19/2013 2:41:09 PM)  105 - 132.  She does not eat much.  Some anorexia get wt back to 120. Push Protein  Seen by Nutrition 09/09/13 and 12/22: Severe malnutrition in the context of chronic illness  -Encouraged PO intake  -Will continue to monitor  Push Boost/Ensure - Supplements; Boost TID.   HYPOTHYROIDISM  On Synthroid 88 Mcg Tabs (Levothyroxine sodium) ..... One po daily  TSH: 0.447  HYPERTENSION - off BP meds - they are not needed Sxs of orthostasis and given IVF.  Check orthostatics this am as well.Marland Kitchen  BMET is fine.   FATIGUE Push nourishment  One B12 shot given a few weeks ago  ID -  Anti-infectives    Start     Dose/Rate Route Frequency Ordered Stop   07/22/14 1700  cefUROXime (CEFTIN) tablet 500 mg     500 mg Oral 2 times daily with meals 07/22/14 1329  07/29/14 1659     DVT Prophylaxis  Will Hep lock, Mobilize, check Orthostatics, get the 2D ECHO, and repeat Cortisol.  Abx and Decadron for URI.  Nothing obvious Head and neck x URI - on Rx.    LOS: 1 day   Larina Lieurance M 07/23/2014, 6:51 AM

## 2014-07-23 NOTE — Progress Notes (Signed)
*  PRELIMINARY RESULTS* Echocardiogram 2D Echocardiogram has been performed.  Leavy Cella 07/23/2014, 9:48 AM

## 2014-07-23 NOTE — Progress Notes (Signed)
I have assessed this pt.  BIL BS are clear and equal and pt is in NAD at this time.

## 2014-07-23 NOTE — Discharge Summary (Signed)
Physician Discharge Summary  DISCHARGE SUMMARY   Patient ID: Kathy Franklin MR#: [id number] DOB/AGE: [birth year] 80 y.o.   Attending 82 M  Patient's VCB:SWHQP,RFFM M, MD  Consults:  none  Admit date: 07/22/2014 Discharge date: 07/23/2014  Discharge Diagnoses:  Active Problems:   Chest pain   Patient Active Problem List   Diagnosis Date Noted  . Chest pain 07/22/2014  . Pulmonary infiltrates 10/22/2013  . Protein-calorie malnutrition, severe 09/10/2013  . CAP (community acquired pneumonia) 09/09/2013  . CLL (chronic lymphocytic leukemia) 07/20/2011   Past Medical History  Diagnosis Date  . CLL (chronic lymphocytic leukemia) 07/20/2011  . Arthritis   . PVC's (premature ventricular contractions)   . Thyroid disease   . Depression     Discharged Condition: good   Discharge Medications:   Medication List    STOP taking these medications        GARLIC PO     OVER THE COUNTER MEDICATION      TAKE these medications        acetaminophen 500 MG tablet  Commonly known as:  TYLENOL  Take 500 mg by mouth every 6 (six) hours as needed for mild pain or headache.     AFLURIA PRESERVATIVE FREE 0.5 ML Susy  Generic drug:  Influenza Virus Vacc Split PF  Inject 0.5 mLs as directed once.     cefUROXime 500 MG tablet  Commonly known as:  CEFTIN  Take 1 tablet (500 mg total) by mouth 2 (two) times daily with a meal.     cholecalciferol 1000 UNITS tablet  Commonly known as:  VITAMIN D  Take 1,000 Units by mouth every morning.     dexamethasone 2 MG tablet  Commonly known as:  DECADRON  Take 1 tablet (2 mg total) by mouth daily for 5 days then 1/2 daily for 5 days     guaiFENesin 600 MG 12 hr tablet  Commonly known as:  MUCINEX  Take 1 tablet (600 mg total) by mouth 2 (two) times daily as needed for cough or to loosen phlegm.     lactose free nutrition Liqd  Take 237 mLs by mouth 3 (three) times daily with meals.     levothyroxine 88 MCG  tablet  Commonly known as:  SYNTHROID, LEVOTHROID  Take 88 mcg by mouth daily before breakfast.     LORazepam 0.5 MG tablet  Commonly known as:  ATIVAN  Take 1 tablet (0.5 mg total) by mouth every 12 (twelve) hours as needed for anxiety.     mirtazapine 7.5 MG tablet  Commonly known as:  REMERON  Take 1 tablet (7.5 mg total) by mouth at bedtime.     oxymetazoline 0.05 % nasal spray  Commonly known as:  AFRIN NASAL SPRAY  Place 1 spray into both nostrils every 6 (six) hours as needed (nose bleed).     polyethylene glycol packet  Commonly known as:  MIRALAX / GLYCOLAX  Take 17 g by mouth daily as needed for mild constipation.        Hospital Procedures: Dg Chest 2 View  07/22/2014   CLINICAL DATA:  Cough for 2 days.  Chest tightness.  Left arm pain  EXAM: CHEST  2 VIEW  COMPARISON:  Dec 27, 2013  FINDINGS: There is a degree of underlying emphysematous change. There is no appreciable edema or consolidation. The heart size is upper normal with pulmonary vascularity reflecting the underlying emphysema. Port-A-Cath tip is in the superior cava. No pneumothorax. No adenopathy. No  bone lesions. There is atherosclerotic calcification in the aorta.  IMPRESSION: Underlying emphysema. No edema or consolidation. No appreciable change from prior study.   Electronically Signed   By: Lowella Grip M.D.   On: 07/22/2014 11:38    History of Present Illness: 80 F c pmh of Anxiety, CLL S/P 6 cycles Chemo per Dr Humphrey Rolls 09/2008 - 03/2009 now Netropenic/Leukopenic--ie Myleosuppression , GERD , Hyperlipidemia , HTN, Ischemic Collitis 99, 05, 09, OA-Back, Osteopenia, PVC's, Hypothyroidism, Tinnitus, Constipation.  PNA - 09/2013 Admission.  s/p recent syncope admission at [facility] @ 1-2 months ago Carotids done 06/06/14 - RICA clear. LICA < 36% blockage She has seen me 2x in last 2 months due to anxiety/frustration d/t one daughter and weight loss Mirtazipine 7.5 HS added - and she is yet to start it She  has also had a lingering cold/URI and has been on Tussionex/Meclizine/MMW She called the am of admission c left sided CP with radiation to left arm associated c some SOB; + nasal congestion and productive cough with green sputum;+ near syncope and sent to ED. Seen and evaluated in ED and felt not MI as EKG and labs (-). ? URI - CXR was (-). I was called to admit for 23 hr Obs and R/Out MI. Update ECHO  She was orthostatic and tilted and given 1/2 Liter of NS. ? DeH Her husband gave her ASA at home and she got more in ED    Hospital Course: Admitted 12/22 c CCP, Presyncope, L arm Radiculopathy, continued Bronchitis, Anxiety and AFTT + HA and ST from the nasal congestion and issues Dizziness improved Less sputum - she thinks the Nebs have helped She was hydrated, ruled out for MI. Started on Abx, nebs and Mucinex. She was well on 12/23 am so we hep locked her, mobilized her, got Negative orthostatics, and did an ECHO.  Results listed below.  After the ECHO came back the only thing pending was the repeat am Cortisol as yesterdays mid afternoon level was 5.6 and lower than expected.  She will go home on Abx and Decadron (in case she had presumed adrenal insufficiency and needs cotrysin stim testing). Nothing obvious on Head and neck exam x URI - on Rx.  Patient ambulated on RA 350 feet. Patient tolerated well, returned to bed. No issues on Tele.  Chest pain. EKG (-) = NSR - PVC. S/P 3(-) Trop I's. CP seems Non-cardiac. Will hold on stress Testing at monitor/Talk about as outpt.  Cough and recurrent URI s/p recent Abx and steroids. Will treat again. NO PNA on CXR. Consider CT chest if needed.  CLL - S/P Rx - stable and well  ANXIETY - continue Remeron.  Frustrated and upset over daughter and her behavior.  Worried @ [employer] operational [employer] and his upcoming operations and memory issues.  All the worry is causing weight loss and some somatic Sxs. Does not take ativan often because she feels like she  has 'cotton in her head'  considering Megace   Syncope - Syncope 05/24/2014. [place] ED - CCT (-), Nml UA, Nml EKG, Nml CXR . Updated ECHO results listed below. She had some Orthostasis and had it again here in ED improved s/p IVF and bolus.  Recent carotid eval were fine recently.  Passed out d/t malnutrition.  Current Sxs seem anxiety and malnutrition as well Needs to eat and gain weight.  Cortisol 07/22/14 was only 5.6 - await am morning cortisol to help c possible adrenal insuff?? Short Prednisone to be  started - considering changing to Decadron - in case Cotrysin stim testing is needed  - Left ventricle: The cavity size was normal. Wall thickness was normal. Systolic function was normal. The estimated ejection fraction was in the range of 50% to 55%. Wall motion was normal; there were no regional wall motion abnormalities. Doppler parameters are consistent with abnormal left ventricular relaxation (grade 1 diastolic dysfunction). - Aortic valve: There was moderate regurgitation. - Mitral valve: Mild prolapse, involving the posterior leaflet. There was mild regurgitation. - Pulmonary arteries: Systolic pressure was mildly increased. - Pericardium, extracardiac: A trivial pericardial effusion was identified.  Impressions:  - Normal LV function; mild prolapse of posterior MV leaflet with mild MR; sclerotic aortic valve with moderate AI; mildly elevated pulmonary pressure.    Malnutrition - Boost. Nutrition consult weight: 106.25 (07/15/2014 11:34:48 AM) weight: 105 (06/06/2014 3:41:16 PM)  weight: 111 (03/25/2014 3:09:53 PM)  weight: 112 (09/19/2013 2:41:09 PM)  105 - 132.  She does not eat much.  Some anorexia get wt back to 120. Push Protein  Seen by Nutrition 09/09/13 and 12/22: Severe malnutrition in the context of chronic illness  -Encouraged PO intake  -Will continue to monitor  Push Boost/Ensure - Supplements; Boost TID.   HYPOTHYROIDISM  On  Synthroid 88 Mcg Tabs (Levothyroxine sodium) ..... One po daily  TSH: 0.447  HYPERTENSION - off BP meds - they are not needed Sxs of orthostasis and given IVF. Check orthostatics this am as well.Marland Kitchen  BMET is fine.   FATIGUE Push nourishment  One B12 shot given a few weeks ago  ID -  Anti-infectives    Start   Dose/Rate Route Frequency Ordered Stop   07/22/14 1700  cefUROXime (CEFTIN) tablet 500 mg    500 mg Oral 2 times daily with meals 07/22/14 1329 07/29/14 1659     DVT Prophylaxis   Day of Discharge Exam BP 143/83 mmHg  Pulse 62  Temp(Src) 98.3 F (36.8 C) (Oral)  Resp 18  Ht 5\' 5"  (1.651 m)  Wt 47.5 kg (104 lb 11.5 oz)  BMI 17.43 kg/m2  SpO2 99%  Physical Exam: See PN earlier  Discharge Labs:  Recent Labs  07/22/14 1046 07/23/14 0659  NA 139 141  K 4.3 4.6  CL 105 105  CO2 25 29  GLUCOSE 94 96  BUN 18 14  CREATININE 0.68 0.83  CALCIUM 9.3 8.8    Recent Labs  07/23/14 0659  AST 19  ALT 13  ALKPHOS 64  BILITOT 0.7  PROT 5.9*  ALBUMIN 3.3*    Recent Labs  07/22/14 1046 07/23/14 0659  WBC 5.0 3.9*  HGB 12.1 11.5*  HCT 38.1 37.3  MCV 92.0 92.8  PLT 155 146*    Recent Labs  07/22/14 1614 07/22/14 2223 07/23/14 0659  TROPONINI <0.03 <0.03 <0.03    Recent Labs  07/22/14 1430  TSH 0.447   No results for input(s): VITAMINB12, FOLATE, FERRITIN, TIBC, IRON, RETICCTPCT in the last 72 hours. No results found for: INR, PROTIME     Discharge instructions:  01-Home or Self Care     Follow-up Information    Follow up with Precious Reel, MD In 1 week.   Specialty:  Internal Medicine   Contact information:   [place] [address] [phone]        Disposition: home  Follow-up Appts: Follow-up with Dr. Virgina Jock at [facility] in 1 week.  Call for appointment.  Condition on Discharge: stable  Tests Needing  Follow-up: Cortisol and whether she needs Cotrysin  Stim  Time spent in discharge (includes decision making & examination of pt): 30 min  Signed: Malayiah Mcbrayer M 07/23/2014, 1:06 PM

## 2014-07-24 LAB — CORTISOL-AM, BLOOD: Cortisol - AM: 17.6 ug/dL (ref 4.3–22.4)

## 2014-08-04 ENCOUNTER — Other Ambulatory Visit: Payer: Self-pay | Admitting: Dermatology

## 2014-12-01 ENCOUNTER — Other Ambulatory Visit: Payer: Self-pay | Admitting: Dermatology

## 2015-04-10 ENCOUNTER — Other Ambulatory Visit: Payer: Self-pay | Admitting: Obstetrics and Gynecology

## 2015-04-13 LAB — CYTOLOGY - PAP

## 2015-05-27 NOTE — Assessment & Plan Note (Signed)
1. Stage II chronic lymphocytic leukemia status post 6 cycles of fludarabine and Rituxan completed March 16, 2009 without evidence of recurrent disease. Her blood count looks terrific white count has recovered very nicely. She has no evidence of recurrent disease.  2. History of ischemic colitis.  3 patient will be seen back in 1 year for followup

## 2015-05-28 ENCOUNTER — Ambulatory Visit (HOSPITAL_BASED_OUTPATIENT_CLINIC_OR_DEPARTMENT_OTHER): Payer: Self-pay | Admitting: Hematology and Oncology

## 2015-05-28 ENCOUNTER — Other Ambulatory Visit (HOSPITAL_BASED_OUTPATIENT_CLINIC_OR_DEPARTMENT_OTHER): Payer: Medicare Other

## 2015-05-28 ENCOUNTER — Encounter: Payer: Self-pay | Admitting: Hematology and Oncology

## 2015-05-28 VITALS — BP 174/65 | HR 56 | Temp 97.8°F | Resp 17 | Ht 65.0 in | Wt 110.3 lb

## 2015-05-28 DIAGNOSIS — Z8579 Personal history of other malignant neoplasms of lymphoid, hematopoietic and related tissues: Secondary | ICD-10-CM

## 2015-05-28 DIAGNOSIS — C9110 Chronic lymphocytic leukemia of B-cell type not having achieved remission: Secondary | ICD-10-CM

## 2015-05-28 LAB — CBC WITH DIFFERENTIAL/PLATELET
BASO%: 0.2 % (ref 0.0–2.0)
Basophils Absolute: 0 10*3/uL (ref 0.0–0.1)
EOS%: 1.1 % (ref 0.0–7.0)
Eosinophils Absolute: 0.1 10*3/uL (ref 0.0–0.5)
HCT: 40.9 % (ref 34.8–46.6)
HGB: 13.5 g/dL (ref 11.6–15.9)
LYMPH#: 1.3 10*3/uL (ref 0.9–3.3)
LYMPH%: 28.9 % (ref 14.0–49.7)
MCH: 31.2 pg (ref 25.1–34.0)
MCHC: 33 g/dL (ref 31.5–36.0)
MCV: 94.5 fL (ref 79.5–101.0)
MONO#: 0.5 10*3/uL (ref 0.1–0.9)
MONO%: 11.4 % (ref 0.0–14.0)
NEUT%: 58.4 % (ref 38.4–76.8)
NEUTROS ABS: 2.7 10*3/uL (ref 1.5–6.5)
Platelets: 146 10*3/uL (ref 145–400)
RBC: 4.33 10*6/uL (ref 3.70–5.45)
RDW: 13.5 % (ref 11.2–14.5)
WBC: 4.6 10*3/uL (ref 3.9–10.3)

## 2015-05-28 LAB — COMPREHENSIVE METABOLIC PANEL (CC13)
ALT: 20 U/L (ref 0–55)
AST: 25 U/L (ref 5–34)
Albumin: 4.2 g/dL (ref 3.5–5.0)
Alkaline Phosphatase: 47 U/L (ref 40–150)
Anion Gap: 7 mEq/L (ref 3–11)
BUN: 21.6 mg/dL (ref 7.0–26.0)
CHLORIDE: 105 meq/L (ref 98–109)
CO2: 27 meq/L (ref 22–29)
CREATININE: 0.8 mg/dL (ref 0.6–1.1)
Calcium: 9.6 mg/dL (ref 8.4–10.4)
EGFR: 64 mL/min/{1.73_m2} — ABNORMAL LOW (ref >90)
Glucose: 93 mg/dl (ref 70–140)
Potassium: 4.4 mEq/L (ref 3.5–5.1)
Sodium: 140 mEq/L (ref 136–145)
Total Bilirubin: 0.46 mg/dL (ref 0.20–1.20)
Total Protein: 6.5 g/dL (ref 6.4–8.3)

## 2015-05-28 LAB — LACTATE DEHYDROGENASE (CC13): LDH: 239 U/L (ref 125–245)

## 2015-05-28 MED ORDER — AZITHROMYCIN 250 MG PO TABS: ORAL_TABLET | ORAL | Status: DC

## 2015-05-28 NOTE — Progress Notes (Signed)
Patient Care Team: Shon Baton, MD as PCP - General (Internal Medicine)  DIAGNOSIS: CLL stage II PAST THERAPY: Status post 6 cycles of fludarabine and Rituxan administered from October 20, 2008 through March 16, 2009.  CURRENT THERAPY: Observation  CHIEF COMPLIANT: Upper respiratory infection  INTERVAL HISTORY: Kathy Franklin is a 81 year old with above-mentioned history of stage II CLL treated with fludarabine and Rituxan for 6 cycles in 2010 is currently in observation. She denies any fevers chills night sweats or weight loss. She had lost some weight previously related to pneumonia. She has not put that weight back on and she feels very good about herself. She is very active and stays quite busy. Denies any signs or symptoms of CLL.  REVIEW OF SYSTEMS:   Constitutional: Denies fevers, chills or abnormal weight loss Eyes: Denies blurriness of vision Ears, nose, mouth, throat, and face: Denies mucositis or sore throat Respiratory: Denies cough, dyspnea or wheezes Cardiovascular: Denies palpitation, chest discomfort or lower extremity swelling Gastrointestinal:  Denies nausea, heartburn or change in bowel habits Skin: Denies abnormal skin rashes Lymphatics: Denies new lymphadenopathy or easy bruising Neurological:Denies numbness, tingling or new weaknesses Behavioral/Psych: Mood is stable, no new changes  All other systems were reviewed with the patient and are negative.  I have reviewed the past medical history, past surgical history, social history and family history with the patient and they are unchanged from previous note.  ALLERGIES:  is allergic to penicillins.  MEDICATIONS:  Current Outpatient Prescriptions  Medication Sig Dispense Refill  . AFLURIA PRESERVATIVE FREE 0.5 ML SUSY Inject 0.5 mLs as directed once.  0  . cholecalciferol (VITAMIN D) 1000 UNITS tablet Take 1,000 Units by mouth every morning.    Marland Kitchen guaiFENesin (MUCINEX) 600 MG 12 hr tablet Take 1 tablet (600 mg  total) by mouth 2 (two) times daily as needed for cough or to loosen phlegm. (Patient not taking: Reported on 07/22/2014)    . lactose free nutrition (BOOST PLUS) LIQD Take 237 mLs by mouth 3 (three) times daily with meals.  0  . levothyroxine (SYNTHROID, LEVOTHROID) 88 MCG tablet Take 88 mcg by mouth daily before breakfast.     . LORazepam (ATIVAN) 0.5 MG tablet Take 1 tablet (0.5 mg total) by mouth every 12 (twelve) hours as needed for anxiety. 30 tablet 0  . mirtazapine (REMERON) 7.5 MG tablet Take 1 tablet (7.5 mg total) by mouth at bedtime.    . polyethylene glycol (MIRALAX / GLYCOLAX) packet Take 17 g by mouth daily as needed for mild constipation. 14 each 0   No current facility-administered medications for this visit.    PHYSICAL EXAMINATION: ECOG PERFORMANCE STATUS: 1 - Symptomatic but completely ambulatory  Filed Vitals:   05/28/15 1118  BP: 174/65  Pulse: 56  Temp: 97.8 F (36.6 C)  Resp: 17   Filed Weights   05/28/15 1118  Weight: 110 lb 4.8 oz (50.032 kg)    GENERAL:alert, no distress and comfortable SKIN: skin color, texture, turgor are normal, no rashes or significant lesions EYES: normal, Conjunctiva are pink and non-injected, sclera clear OROPHARYNX:no exudate, no erythema and lips, buccal mucosa, and tongue normal  NECK: supple, thyroid normal size, non-tender, without nodularity LYMPH:  no palpable lymphadenopathy in the cervical, axillary or inguinal LUNGS: clear to auscultation and percussion with normal breathing effort HEART: regular rate & rhythm and no murmurs and no lower extremity edema ABDOMEN:abdomen soft, non-tender and normal bowel sounds Musculoskeletal:no cyanosis of digits and no clubbing  NEURO:  alert & oriented x 3 with fluent speech, no focal motor/sensory deficits  LABORATORY DATA:  I have reviewed the data as listed   Chemistry      Component Value Date/Time   NA 141 07/23/2014 0659   NA 138 05/30/2014 1130   NA 141 06/16/2010 1101     K 4.6 07/23/2014 0659   K 4.4 05/30/2014 1130   K 4.8* 06/16/2010 1101   CL 105 07/23/2014 0659   CL 105 08/31/2012 1100   CL 103 06/16/2010 1101   CO2 29 07/23/2014 0659   CO2 25 05/30/2014 1130   CO2 30 06/16/2010 1101   BUN 14 07/23/2014 0659   BUN 21.7 05/30/2014 1130   BUN 25* 06/16/2010 1101   CREATININE 0.83 07/23/2014 0659   CREATININE 0.7 05/30/2014 1130   CREATININE 0.9 06/16/2010 1101      Component Value Date/Time   CALCIUM 8.8 07/23/2014 0659   CALCIUM 9.2 05/30/2014 1130   CALCIUM 9.7 06/16/2010 1101   ALKPHOS 64 07/23/2014 0659   ALKPHOS 82 05/30/2014 1130   ALKPHOS 38 06/16/2010 1101   AST 19 07/23/2014 0659   AST 24 05/30/2014 1130   AST 23 06/16/2010 1101   ALT 13 07/23/2014 0659   ALT 16 05/30/2014 1130   ALT 21 06/16/2010 1101   BILITOT 0.7 07/23/2014 0659   BILITOT 0.50 05/30/2014 1130   BILITOT 0.60 06/16/2010 1101       Lab Results  Component Value Date   WBC 4.6 05/28/2015   HGB 13.5 05/28/2015   HCT 40.9 05/28/2015   MCV 94.5 05/28/2015   PLT 146 05/28/2015   NEUTROABS 2.7 05/28/2015   ASSESSMENT & PLAN:  CLL (chronic lymphocytic leukemia) 1. Stage II chronic lymphocytic leukemia status post 6 cycles of fludarabine and Rituxan completed March 16, 2009 without evidence of recurrent disease. Her blood count looks terrific white count has recovered very nicely. She has no evidence of recurrent disease.  2. History of ischemic colitis.  3. Hypertension: Incidentally found. I instructed the patient to recheck it when she gets home and to call her primary care physician if necessary. 4. Upper respiratory infection with sore throat: I prescribed her Z-Pak today.  I discussed with her surveillance options for CLL since she has been doing quite well, she would like to follow with the primary care physician and to see [place] back only on an as-needed basis.     return to clinic as needed.   No orders of the defined types were placed in this  encounter.   The patient has a good understanding of the overall plan. she agrees with it. she will call with any problems that may develop before the next visit here.   Rulon Eisenmenger, MD 05/28/2015

## 2015-06-27 ENCOUNTER — Emergency Department (HOSPITAL_COMMUNITY): Payer: Medicare Other

## 2015-06-27 ENCOUNTER — Inpatient Hospital Stay (HOSPITAL_COMMUNITY)
Admission: EM | Admit: 2015-06-27 | Discharge: 2015-06-29 | DRG: 064 | Disposition: A | Discharge status: Home / Self Care | Payer: Medicare Other | Attending: Internal Medicine | Admitting: Internal Medicine

## 2015-06-27 ENCOUNTER — Encounter (HOSPITAL_COMMUNITY): Payer: Self-pay | Admitting: Vascular Surgery

## 2015-06-27 ENCOUNTER — Inpatient Hospital Stay (HOSPITAL_COMMUNITY): Payer: Medicare Other

## 2015-06-27 DIAGNOSIS — E039 Hypothyroidism, unspecified: Secondary | ICD-10-CM | POA: Diagnosis present

## 2015-06-27 DIAGNOSIS — Z9841 Cataract extraction status, right eye: Secondary | ICD-10-CM

## 2015-06-27 DIAGNOSIS — Z7982 Long term (current) use of aspirin: Secondary | ICD-10-CM | POA: Diagnosis not present

## 2015-06-27 DIAGNOSIS — E785 Hyperlipidemia, unspecified: Secondary | ICD-10-CM

## 2015-06-27 DIAGNOSIS — M1612 Unilateral primary osteoarthritis, left hip: Secondary | ICD-10-CM | POA: Diagnosis present

## 2015-06-27 DIAGNOSIS — R29810 Facial weakness: Secondary | ICD-10-CM | POA: Diagnosis present

## 2015-06-27 DIAGNOSIS — Z9842 Cataract extraction status, left eye: Secondary | ICD-10-CM

## 2015-06-27 DIAGNOSIS — C9110 Chronic lymphocytic leukemia of B-cell type not having achieved remission: Secondary | ICD-10-CM | POA: Diagnosis present

## 2015-06-27 DIAGNOSIS — E43 Unspecified severe protein-calorie malnutrition: Secondary | ICD-10-CM | POA: Diagnosis present

## 2015-06-27 DIAGNOSIS — F329 Major depressive disorder, single episode, unspecified: Secondary | ICD-10-CM | POA: Diagnosis present

## 2015-06-27 DIAGNOSIS — I639 Cerebral infarction, unspecified: Secondary | ICD-10-CM | POA: Diagnosis present

## 2015-06-27 DIAGNOSIS — Z880 Allergy status to penicillin: Secondary | ICD-10-CM

## 2015-06-27 DIAGNOSIS — I6629 Occlusion and stenosis of unspecified posterior cerebral artery: Secondary | ICD-10-CM | POA: Diagnosis present

## 2015-06-27 DIAGNOSIS — I6789 Other cerebrovascular disease: Secondary | ICD-10-CM | POA: Diagnosis not present

## 2015-06-27 DIAGNOSIS — I638 Other cerebral infarction: Secondary | ICD-10-CM | POA: Diagnosis not present

## 2015-06-27 DIAGNOSIS — E038 Other specified hypothyroidism: Secondary | ICD-10-CM | POA: Diagnosis not present

## 2015-06-27 LAB — CBC
HCT: 41.8 % (ref 36.0–46.0)
Hemoglobin: 13.6 g/dL (ref 12.0–15.0)
MCH: 30.8 pg (ref 26.0–34.0)
MCHC: 32.5 g/dL (ref 30.0–36.0)
MCV: 94.6 fL (ref 78.0–100.0)
PLATELETS: 156 10*3/uL (ref 150–400)
RBC: 4.42 MIL/uL (ref 3.87–5.11)
RDW: 13.4 % (ref 11.5–15.5)
WBC: 5.7 10*3/uL (ref 4.0–10.5)

## 2015-06-27 LAB — DIFFERENTIAL
Basophils Absolute: 0 10*3/uL (ref 0.0–0.1)
Basophils Relative: 0 %
EOS PCT: 0 %
Eosinophils Absolute: 0 10*3/uL (ref 0.0–0.7)
LYMPHS ABS: 1.1 10*3/uL (ref 0.7–4.0)
LYMPHS PCT: 19 %
MONO ABS: 0.2 10*3/uL (ref 0.1–1.0)
Monocytes Relative: 4 %
NEUTROS ABS: 4.4 10*3/uL (ref 1.7–7.7)
Neutrophils Relative %: 77 %

## 2015-06-27 LAB — COMPREHENSIVE METABOLIC PANEL
ALT: 19 U/L (ref 14–54)
AST: 24 U/L (ref 15–41)
Albumin: 4.4 g/dL (ref 3.5–5.0)
Alkaline Phosphatase: 38 U/L (ref 38–126)
Anion gap: 9 (ref 5–15)
BUN: 23 mg/dL — ABNORMAL HIGH (ref 6–20)
CO2: 25 mmol/L (ref 22–32)
CREATININE: 0.86 mg/dL (ref 0.44–1.00)
Calcium: 9.2 mg/dL (ref 8.9–10.3)
Chloride: 104 mmol/L (ref 101–111)
GFR calc Af Amer: >60 mL/min (ref >60)
GFR calc non Af Amer: 59 mL/min — ABNORMAL LOW (ref >60)
Glucose, Bld: 113 mg/dL — ABNORMAL HIGH (ref 65–99)
Potassium: 4 mmol/L (ref 3.5–5.1)
SODIUM: 138 mmol/L (ref 135–145)
TOTAL PROTEIN: 6.2 g/dL — AB (ref 6.5–8.1)
Total Bilirubin: 0.5 mg/dL (ref 0.3–1.2)

## 2015-06-27 LAB — PROTIME-INR
INR: 1.08 (ref 0.00–1.49)
PROTHROMBIN TIME: 14.2 s (ref 11.6–15.2)

## 2015-06-27 LAB — I-STAT TROPONIN, ED: TROPONIN I, POC: 0.01 ng/mL (ref 0.00–0.08)

## 2015-06-27 LAB — APTT: aPTT: 27 seconds (ref 24–37)

## 2015-06-27 MED ORDER — ASPIRIN 300 MG RE SUPP: 300.0000 mg | Freq: Every day | RECTAL | Status: DC

## 2015-06-27 MED ORDER — ACETAMINOPHEN 650 MG RE SUPP: 650.0000 mg | RECTAL | Status: DC | PRN

## 2015-06-27 MED ORDER — ASPIRIN 325 MG PO TABS
325.0000 mg | ORAL_TABLET | Freq: Every day | ORAL | Status: DC
  Administered 2015-06-27 – 2015-06-29 (×3): 325 mg via ORAL
  Filled 2015-06-27 (×3): qty 1

## 2015-06-27 MED ORDER — SODIUM CHLORIDE 0.9 % IV SOLN
INTRAVENOUS | Status: DC
  Administered 2015-06-27 – 2015-06-29 (×2): via INTRAVENOUS

## 2015-06-27 MED ORDER — HYDRALAZINE HCL 20 MG/ML IJ SOLN: 10.0000 mg | Freq: Four times a day (QID) | INTRAMUSCULAR | Status: DC | PRN

## 2015-06-27 MED ORDER — LEVOTHYROXINE SODIUM 88 MCG PO TABS
88.0000 ug | ORAL_TABLET | Freq: Every day | ORAL | Status: DC
  Administered 2015-06-28 – 2015-06-29 (×2): 88 ug via ORAL
  Filled 2015-06-27 (×2): qty 1

## 2015-06-27 MED ORDER — STROKE: EARLY STAGES OF RECOVERY BOOK
Freq: Once | Status: AC
  Administered 2015-06-27: 19:00:00
  Filled 2015-06-27: qty 1

## 2015-06-27 MED ORDER — VITAMIN D 1000 UNITS PO TABS
1000.0000 [IU] | ORAL_TABLET | Freq: Every morning | ORAL | Status: DC
  Administered 2015-06-28 – 2015-06-29 (×2): 1000 [IU] via ORAL
  Filled 2015-06-27 (×2): qty 1

## 2015-06-27 MED ORDER — ENOXAPARIN SODIUM 40 MG/0.4ML ~~LOC~~ SOLN
40.0000 mg | SUBCUTANEOUS | Status: DC
  Administered 2015-06-27 – 2015-06-28 (×2): 40 mg via SUBCUTANEOUS
  Filled 2015-06-27 (×2): qty 0.4

## 2015-06-27 MED ORDER — ACETAMINOPHEN 325 MG PO TABS
650.0000 mg | ORAL_TABLET | ORAL | Status: DC | PRN
  Administered 2015-06-28 – 2015-06-29 (×3): 650 mg via ORAL
  Filled 2015-06-27 (×3): qty 2

## 2015-06-27 NOTE — H&P (Signed)
History and Physical        Hospital Admission Note Date: 06/27/2015  Patient name: Kathy Franklin Medical record number: [id number] Date of birth: Feb 12, 1927 Age: 81 y.o. Gender: female  PCP: Precious Reel, MD  Referring physician: Lorette Ang, [facility]  Chief Complaint:  Left-sided numbness and tingling, weakness, dizziness today  HPI: Patient is a 81 year old female with CLL, hypothyroidism presented to ED with numbness, tingling and weakness on the left side. Patient reported that she was fine when she woke up, around 9-9:30 AM, she noticed that she was having numbness, tingling and weakness in her left arm and left leg. She has a chronic left hip osteoarthritis and initially attributed her left leg symptoms to the arthritis. Around 10:30 AM, she started feeling dizzy and lightheaded, nausea, headache and worsening of her symptoms. EMS was called and patient presented to ED. She denied any facial drooping or difficulty in speech or swallowing. At the time of my encounter, patient reports that her left leg feels heavy although her symptoms are improving but not back to baseline yet. Not on aspirin prior to admission, no prior history of CVA or TIA.  In ED, CBC, CMET unremarkable. MRI of the brain showed a possible 6 mm acute infarct in the posterior right frontal lobe, mild chronic small vessel ischemic disease and chronic bilateral basal ganglia lacunar infarcts.  Review of Systems:  Constitutional: Denies fever, chills, diaphoresis, poor appetite and fatigue.  HEENT: Denies photophobia, eye pain, redness, hearing loss, ear pain, congestion, sore throat, rhinorrhea, sneezing, mouth sores, trouble swallowing, neck pain, neck stiffness and tinnitus.   Respiratory: Denies SOB, DOE, cough, chest tightness,  and wheezing.   Cardiovascular: Denies chest pain, palpitations and leg swelling.    Gastrointestinal: Denies nausea, vomiting, abdominal pain, diarrhea, constipation, blood in stool and abdominal distention.  Genitourinary: Denies dysuria, urgency, frequency, hematuria, flank pain and difficulty urinating.  Musculoskeletal: Denies myalgias, back pain, joint swelling, arthralgias and gait problem.  +chronic left hip pain Skin: Denies pallor, rash and wound.  Neurological: Please see history of present illness Hematological: Denies adenopathy. Easy bruising, personal or family bleeding history  Psychiatric/Behavioral: Denies suicidal ideation, mood changes, confusion, nervousness, sleep disturbance and agitation  Past Medical History: Past Medical History  Diagnosis Date  . CLL (chronic lymphocytic leukemia) ([place]) 07/20/2011  . Arthritis   . PVC's (premature ventricular contractions)   . Thyroid disease   . Depression     Past Surgical History  Procedure Laterality Date  . Abdominal hysterectomy    . Appendectomy    . Hernia repair      Medications: Prior to Admission medications   Medication Sig Start Date End Date Taking? Authorizing Provider  cholecalciferol (VITAMIN D) 1000 UNITS tablet Take 1,000 Units by mouth every morning.   Yes Historical Provider, MD  Fish Oil-Krill Oil (KRILL OIL PLUS) CAPS Take 1 capsule by mouth daily.    Yes Historical Provider, MD  GARLIC PO Take 1 tablet by mouth daily.    Yes Historical Provider, MD  levothyroxine (SYNTHROID, LEVOTHROID) 88 MCG tablet Take 88 mcg by mouth daily before breakfast.    Yes Historical Provider, MD  azithromycin [facility]  Z-PAK) 250 MG tablet Zpak Use as directed 05/28/15   Nicholas Lose, MD  LORazepam (ATIVAN) 0.5 MG tablet Take 1 tablet (0.5 mg total) by mouth every 12 (twelve) hours as needed for anxiety. 07/23/14   Shon Baton, MD  polyethylene glycol [place] / Floria Raveling) packet Take 17 g by mouth daily as needed for mild constipation. 07/23/14   Shon Baton, MD    Allergies:   Allergies   Allergen Reactions  . Penicillins Rash    Social History:  reports that she has never smoked. She has never used smokeless tobacco. She reports that she does not drink alcohol or use illicit drugs.  Family History: Family History  Problem Relation Age of Onset  . Asthma Neg Hx   . Emphysema Neg Hx      Physical Exam: Blood pressure 189/91, pulse 119, temperature 97.4 F (36.3 C), temperature source Oral, resp. rate 21, SpO2 97 %. General: Alert, awake, oriented x3, in no acute distress, pleasant and cooperative. HEENT: normocephalic, atraumatic, anicteric sclera, pink conjunctiva, pupils equal and reactive to light and accomodation, oropharynx clear Neck: supple, no masses or lymphadenopathy, no goiter, no bruits  Heart: Regular rate and rhythm, without murmurs, rubs or gallops. Lungs: Clear to auscultation bilaterally, no wheezing, rales or rhonchi. Abdomen: Soft, nontender, nondistended, positive bowel sounds, no masses. Extremities: No clubbing, cyanosis or edema with positive pedal pulses. Neuro: Grossly intact, no focal neurological deficits, strength 5/5 upper and lower extremities on the right, 4-5/5 in the right upper and 3-4/5 LLE (patient reports chronic weakness due to the hip arthritis) Psych: alert and oriented x 3, normal mood and affect Skin: no rashes or lesions, warm and dry   LABS on Admission:  Basic Metabolic Panel:  Recent Labs Lab 06/27/15 1319  NA 138  K 4.0  CL 104  CO2 25  GLUCOSE 113*  BUN 23*  CREATININE 0.86  CALCIUM 9.2   Liver Function Tests:  Recent Labs Lab 06/27/15 1319  AST 24  ALT 19  ALKPHOS 38  BILITOT 0.5  PROT 6.2*  ALBUMIN 4.4   No results for input(s): LIPASE, AMYLASE in the last 168 hours. No results for input(s): AMMONIA in the last 168 hours. CBC:  Recent Labs Lab 06/27/15 1319  WBC 5.7  NEUTROABS 4.4  HGB 13.6  HCT 41.8  MCV 94.6  PLT 156   Cardiac Enzymes: No results for input(s): CKTOTAL, CKMB,  CKMBINDEX, TROPONINI in the last 168 hours. BNP: Invalid input(s): POCBNP CBG: No results for input(s): GLUCAP in the last 168 hours.  Radiological Exams on Admission:  Mr Brain Wo Contrast  06/27/2015  CLINICAL DATA:  Left-sided weakness and numbness and tingling. Dizziness. EXAM: MRI HEAD WITHOUT CONTRAST TECHNIQUE: Multiplanar, multiecho pulse sequences of the brain and surrounding structures were obtained without intravenous contrast. COMPARISON:  Head CT 02/17/2014 FINDINGS: There is a 6 mm focus of cortical/subcortical signal abnormality in the high posterior right frontal lobe on axial diffusion weighted images with suggestion of subtly restricted diffusion on the ADC map. This is not confirmed on coronal diffusion images. There is no other evidence of acute infarct. No intracranial hemorrhage, mass, midline shift, or extra-axial fluid collection is seen. There is mild generalized cerebral atrophy. Small foci of T2 hyperintensity scattered throughout the subcortical and deep cerebral white matter bilaterally are nonspecific but compatible with mild chronic small vessel ischemic disease. There are chronic lacunar infarcts at the posterior aspects of both putamina. Prior bilateral cataract extraction is noted. Paranasal sinuses  and mastoid air cells are clear. Major intracranial vascular flow voids are preserved. Left vertebral artery is dominant. IMPRESSION: 1. Possible 6 mm acute infarct in the posterior right frontal lobe. 2. Mild chronic small vessel ischemic disease and chronic bilateral basal ganglia lacunar infarcts. Electronically Signed   By: Logan Bores M.D.   On: 06/27/2015 15:59    *I have personally reviewed the images above*  EKG: Independently reviewed. Rate 58, normal sinus rhythm, PVCs   Assessment/Plan Principal Problem:  Acute CVA (cerebral infarction): Presenting with left-sided numbness and tingling and weakness - MRI of the brain showed 6 mm acute infarct in the  posterior right frontal lobe - admit to telemetry, obtain MRA, 2-D echo, carotid Dopplers for further workup - Neurology consulted, start aspirin 325 mg daily - Obtain lipid panel, hemoglobin A1c - PT, OT, ST evaluation - Patient passed the swallow testing in ED, continue heart healthy diet  Active Problems:   CLL (chronic lymphocytic leukemia) (HCC) - Per patient, has been stable, follows Dr. Lindi Adie outpatient. Recent outpatient visit on 10/27, no evidence of recurrent disease.    Protein-calorie malnutrition, severe ([place]) -  nutrition consult    Hypothyroidism - Obtain TSH, continue Synthroid   DVT prophylaxis:  Lovenox   CODE STATUS:  full CODE STATUS   Family Communication: Admission, patients condition and plan of care including tests being ordered have been discussed with the patient and  2 daughters who indicates understanding and agree with the plan and Code Status  Disposition plan: Further plan will depend as patient's clinical course evolves and further radiologic and laboratory data become available. Likely home  when medically ready.   Time Spent on Admission: 77mins   Zaylin Runco M.D. [facility] 06/27/2015, 5:43 PM Pager: [phone]  If 7PM-7AM, please contact night-coverage [email or web address] Password [email or web address]

## 2015-06-27 NOTE — Consult Note (Signed)
Admission H&P    Chief Complaint: Acute onset of left-sided weakness and numbness.  HPI: Kathy Franklin is an 81 y.o. female with a history of CLL, arthritis and hypothyroidism, presenting with new onset left side weakness and numbness. She was last known well at 9 AM today. There's no previous history of stroke nor TIA. She has not been on antiplatelet therapy. MRI showed a 6 mm acute infarction involving posterior right frontal lobe. NIH score was 2 for sensory changes and mild facial weakness on the left.  LSN: 9 AM on 06/27/2015 tPA Given: No: Minimal residual deficits mRankin:  Past Medical History  Diagnosis Date  . CLL (chronic lymphocytic leukemia) ([place]) 07/20/2011  . Arthritis   . PVC's (premature ventricular contractions)   . Thyroid disease   . Depression     Past Surgical History  Procedure Laterality Date  . Abdominal hysterectomy    . Appendectomy    . Hernia repair      Family History  Problem Relation Age of Onset  . Asthma Neg Hx   . Emphysema Neg Hx    Social History:  reports that she has never smoked. She has never used smokeless tobacco. She reports that she does not drink alcohol or use illicit drugs.  Allergies:  Allergies  Allergen Reactions  . Penicillins Rash   Medications: Patient's preadmission medications were reviewed by me.  ROS: History obtained from the patient and patient's daughters.  General ROS: negative for - chills, fatigue, fever, night sweats, weight gain or weight loss Psychological ROS: negative for - behavioral disorder, hallucinations, memory difficulties, mood swings or suicidal ideation Ophthalmic ROS: negative for - blurry vision, double vision, eye pain or loss of vision ENT ROS: negative for - epistaxis, nasal discharge, oral lesions, sore throat, tinnitus or vertigo Allergy and Immunology ROS: negative for - hives or itchy/watery eyes Hematological and Lymphatic ROS: negative for - bleeding problems, bruising or  swollen lymph nodes Endocrine ROS: negative for - galactorrhea, hair pattern changes, polydipsia/polyuria or temperature intolerance Respiratory ROS: negative for - cough, hemoptysis, shortness of breath or wheezing Cardiovascular ROS: negative for - chest pain, dyspnea on exertion, edema or irregular heartbeat Gastrointestinal ROS: negative for - abdominal pain, diarrhea, hematemesis, nausea/vomiting or stool incontinence Genito-Urinary ROS: negative for - dysuria, hematuria, incontinence or urinary frequency/urgency Musculoskeletal ROS: negative for - joint swelling or muscular weakness Neurological ROS: as noted in HPI Dermatological ROS: negative for rash and skin lesion changes  Physical Examination: Blood pressure 189/91, pulse 119, temperature 97.4 F (36.3 C), temperature source Oral, resp. rate 21, SpO2 97 %.  HEENT-  Normocephalic, no lesions, without obvious abnormality.  Normal external eye and conjunctiva.  Normal TM's bilaterally.  Normal auditory canals and external ears. Normal external nose, mucus membranes and septum.  Normal pharynx. Neck supple with no masses, nodes, nodules or enlargement. Cardiovascular - regular rate and rhythm, S1, S2 normal, no murmur, click, rub or gallop Lungs - chest clear, no wheezing, rales, normal symmetric air entry Abdomen - soft, non-tender; bowel sounds normal; no masses,  no organomegaly Extremities - no joint deformities, effusion, or inflammation and no edema  Neurologic Examination: Mental Status: Alert, oriented, thought content appropriate.  Speech fluent without evidence of aphasia. Able to follow commands without difficulty. Cranial Nerves: II-Visual fields were normal. III/IV/VI-Pupils were equal and reacted normally to light. Extraocular movements were full and conjugate.    V/VII-slight left facial numbness; mild left lower facial weakness. VIII-normal. X-normal speech XI: trapezius  strength/neck flexion strength normal  bilaterally XII-midline tongue extension with normal strength. Motor: 5/5 bilaterally with normal tone and bulk Sensory: Mild numbness over left extremities compared to right extremities. Deep Tendon Reflexes: 2+ and symmetric. Plantars: Mute bilaterally Cerebellar: Normal finger-to-nose testing. Carotid auscultation: Normal  Results for orders placed or performed during the hospital encounter of 06/27/15 (from the past 48 hour(s))  Protime-INR     Status: None   Collection Time: 06/27/15  1:19 PM  Result Value Ref Range   Prothrombin Time 14.2 11.6 - 15.2 seconds   INR 1.08 0.00 - 1.49  APTT     Status: None   Collection Time: 06/27/15  1:19 PM  Result Value Ref Range   aPTT 27 24 - 37 seconds  CBC     Status: None   Collection Time: 06/27/15  1:19 PM  Result Value Ref Range   WBC 5.7 4.0 - 10.5 K/uL   RBC 4.42 3.87 - 5.11 MIL/uL   Hemoglobin 13.6 12.0 - 15.0 g/dL   HCT 41.8 36.0 - 46.0 %   MCV 94.6 78.0 - 100.0 fL   MCH 30.8 26.0 - 34.0 pg   MCHC 32.5 30.0 - 36.0 g/dL   RDW 13.4 11.5 - 15.5 %   Platelets 156 150 - 400 K/uL  Differential     Status: None   Collection Time: 06/27/15  1:19 PM  Result Value Ref Range   Neutrophils Relative % 77 %   Neutro Abs 4.4 1.7 - 7.7 K/uL   Lymphocytes Relative 19 %   Lymphs Abs 1.1 0.7 - 4.0 K/uL   Monocytes Relative 4 %   Monocytes Absolute 0.2 0.1 - 1.0 K/uL   Eosinophils Relative 0 %   Eosinophils Absolute 0.0 0.0 - 0.7 K/uL   Basophils Relative 0 %   Basophils Absolute 0.0 0.0 - 0.1 K/uL  Comprehensive metabolic panel     Status: Abnormal   Collection Time: 06/27/15  1:19 PM  Result Value Ref Range   Sodium 138 135 - 145 mmol/L   Potassium 4.0 3.5 - 5.1 mmol/L   Chloride 104 101 - 111 mmol/L   CO2 25 22 - 32 mmol/L   Glucose, Bld 113 (H) 65 - 99 mg/dL   BUN 23 (H) 6 - 20 mg/dL   Creatinine, Ser 0.86 0.44 - 1.00 mg/dL   Calcium 9.2 8.9 - 10.3 mg/dL   Total Protein 6.2 (L) 6.5 - 8.1 g/dL   Albumin 4.4 3.5 - 5.0 g/dL    AST 24 15 - 41 U/L   ALT 19 14 - 54 U/L   Alkaline Phosphatase 38 38 - 126 U/L   Total Bilirubin 0.5 0.3 - 1.2 mg/dL   GFR calc non Af Amer 59 (L) >60 mL/min   GFR calc Af Amer >60 >60 mL/min    Comment: (NOTE) The eGFR has been calculated using the CKD EPI equation. This calculation has not been validated in all clinical situations. eGFR's persistently <60 mL/min signify possible Chronic Kidney Disease.    Anion gap 9 5 - 15  I-stat troponin, ED (not at [facility])     Status: None   Collection Time: 06/27/15  1:20 PM  Result Value Ref Range   Troponin i, poc 0.01 0.00 - 0.08 ng/mL   Comment 3            Comment: Due to the release kinetics of cTnI, a negative result within the first hours of the onset of symptoms does  not rule out myocardial infarction with certainty. If myocardial infarction is still suspected, repeat the test at appropriate intervals.    Mr Brain Wo Contrast  06/27/2015  CLINICAL DATA:  Left-sided weakness and numbness and tingling. Dizziness. EXAM: MRI HEAD WITHOUT CONTRAST TECHNIQUE: Multiplanar, multiecho pulse sequences of the brain and surrounding structures were obtained without intravenous contrast. COMPARISON:  Head CT 02/17/2014 FINDINGS: There is a 6 mm focus of cortical/subcortical signal abnormality in the high posterior right frontal lobe on axial diffusion weighted images with suggestion of subtly restricted diffusion on the ADC map. This is not confirmed on coronal diffusion images. There is no other evidence of acute infarct. No intracranial hemorrhage, mass, midline shift, or extra-axial fluid collection is seen. There is mild generalized cerebral atrophy. Small foci of T2 hyperintensity scattered throughout the subcortical and deep cerebral white matter bilaterally are nonspecific but compatible with mild chronic small vessel ischemic disease. There are chronic lacunar infarcts at the posterior aspects of both putamina. Prior bilateral cataract  extraction is noted. Paranasal sinuses and mastoid air cells are clear. Major intracranial vascular flow voids are preserved. Left vertebral artery is dominant. IMPRESSION: 1. Possible 6 mm acute infarct in the posterior right frontal lobe. 2. Mild chronic small vessel ischemic disease and chronic bilateral basal ganglia lacunar infarcts. Electronically Signed   By: Logan Bores M.D.   On: 06/27/2015 15:59    Assessment: 81 y.o. female presenting with a small posterior right frontal acute ischemic infarction with mild residual left motor sensory deficits.  Stroke Risk Factors - family history  Plan: 1. HgbA1c, fasting lipid panel 2. MRA  of the brain without contrast 3. PT consult, OT consult 4. Echocardiogram 5. Carotid dopplers 6. Prophylactic therapy-Antiplatelet med: Aspirin 81 mg per day 7. Risk factor modification 8. Telemetry monitoring  C.R. Nicole Kindred, MD [facility] [phone]  06/27/2015, 5:52 PM

## 2015-06-27 NOTE — ED Provider Notes (Signed)
CSN: [id number]     Arrival date & time 06/27/15  1223 History   First MD Initiated Contact with Patient 06/27/15 1255     Chief Complaint  Patient presents with  . Dizziness     (Consider location/radiation/quality/duration/timing/severity/associated sxs/prior Treatment) HPI Patient presents to the emergency department with numbness and weakness on the left side.  The patient states she developed this after awakening this morning.  She states she is unsure if she woke up with this or not.  She feels like it got worse after she woke up.  The patient states that she does not have any other complaints.  She is denying chest pain, shortness of breath, weakness, dizziness, headache, blurred vision, back pain, neck pain, fever, dysuria, incontinence, bloody stool, hematemesis, near syncope or syncope.  He should states that her left leg feels heavy, but she can walk Past Medical History  Diagnosis Date  . CLL (chronic lymphocytic leukemia) ([place]) 07/20/2011  . Arthritis   . PVC's (premature ventricular contractions)   . Thyroid disease   . Depression    Past Surgical History  Procedure Laterality Date  . Abdominal hysterectomy    . Appendectomy    . Hernia repair     Family History  Problem Relation Age of Onset  . Asthma Neg Hx   . Emphysema Neg Hx    Social History  Substance Use Topics  . Smoking status: Never Smoker   . Smokeless tobacco: Never Used  . Alcohol Use: No   OB History    No data available     Review of Systems  All other systems negative except as documented in the HPI. All pertinent positives and negatives as reviewed in the HPI.  Allergies  Penicillins  Home Medications   Prior to Admission medications   Medication Sig Start Date End Date Taking? Authorizing Provider  cholecalciferol (VITAMIN D) 1000 UNITS tablet Take 1,000 Units by mouth every morning.   Yes Historical Provider, MD  levothyroxine (SYNTHROID, LEVOTHROID) 88 MCG tablet Take 88 mcg by  mouth daily before breakfast.    Yes Historical Provider, MD  azithromycin (ZITHROMAX Z-PAK) 250 MG tablet Zpak Use as directed 05/28/15   Nicholas Lose, MD  LORazepam (ATIVAN) 0.5 MG tablet Take 1 tablet (0.5 mg total) by mouth every 12 (twelve) hours as needed for anxiety. 07/23/14   Shon Baton, MD  polyethylene glycol [facility] Floria Raveling) packet Take 17 g by mouth daily as needed for mild constipation. 07/23/14   Shon Baton, MD   BP 144/73 mmHg  Pulse 58  Temp(Src) 97.8 F (36.6 C) (Oral)  Resp 17  SpO2 95% Physical Exam  Constitutional: She is oriented to person, place, and time. She appears well-developed and well-nourished. No distress.  HENT:  Head: Normocephalic and atraumatic.  Mouth/Throat: Oropharynx is clear and moist.  Eyes: Pupils are equal, round, and reactive to light.  Neck: Normal range of motion. Neck supple.  Cardiovascular: Normal rate, regular rhythm and normal heart sounds.  Exam reveals no gallop and no friction rub.   No murmur heard. Pulmonary/Chest: Effort normal and breath sounds normal. No respiratory distress. She has no wheezes.  Abdominal: Soft. Bowel sounds are normal. She exhibits no distension. There is no tenderness.  Neurological: She is alert and oriented to person, place, and time. She has normal strength. No sensory deficit. She exhibits normal muscle tone. Coordination and gait normal. GCS eye subscore is 4. GCS verbal subscore is 5. GCS motor subscore is 6.  Skin: Skin is warm and dry. No rash noted. No erythema.  Psychiatric: She has a normal mood and affect. Her behavior is normal.  Nursing note and vitals reviewed.   ED Course  Procedures (including critical care time) Labs Review Labs Reviewed  COMPREHENSIVE METABOLIC PANEL - Abnormal; Notable for the following:    Glucose, Bld 113 (*)    BUN 23 (*)    Total Protein 6.2 (*)    GFR calc non Af Amer 59 (*)    All other components within normal limits  PROTIME-INR  APTT  CBC   DIFFERENTIAL  Randolm Idol, ED    Imaging Review Mr Brain Wo Contrast  06/27/2015  CLINICAL DATA:  Left-sided weakness and numbness and tingling. Dizziness. EXAM: MRI HEAD WITHOUT CONTRAST TECHNIQUE: Multiplanar, multiecho pulse sequences of the brain and surrounding structures were obtained without intravenous contrast. COMPARISON:  Head CT 02/17/2014 FINDINGS: There is a 6 mm focus of cortical/subcortical signal abnormality in the high posterior right frontal lobe on axial diffusion weighted images with suggestion of subtly restricted diffusion on the ADC map. This is not confirmed on coronal diffusion images. There is no other evidence of acute infarct. No intracranial hemorrhage, mass, midline shift, or extra-axial fluid collection is seen. There is mild generalized cerebral atrophy. Small foci of T2 hyperintensity scattered throughout the subcortical and deep cerebral white matter bilaterally are nonspecific but compatible with mild chronic small vessel ischemic disease. There are chronic lacunar infarcts at the posterior aspects of both putamina. Prior bilateral cataract extraction is noted. Paranasal sinuses and mastoid air cells are clear. Major intracranial vascular flow voids are preserved. Left vertebral artery is dominant. IMPRESSION: 1. Possible 6 mm acute infarct in the posterior right frontal lobe. 2. Mild chronic small vessel ischemic disease and chronic bilateral basal ganglia lacunar infarcts. Electronically Signed   By: Logan Bores M.D.   On: 06/27/2015 15:59   I have personally reviewed and evaluated these images and lab results as part of my medical decision-making.   EKG Interpretation   Date/Time:  Saturday June 27 2015 12:28:55 EST Ventricular Rate:  58 PR Interval:  181 QRS Duration: 93 QT Interval:  463 QTC Calculation: 455 R Axis:   73 Text Interpretation:  Sinus rhythm Ventricular bigeminy Left ventricular  hypertrophy Anterior infarct, old no significant  change since 2015  Confirmed by GOLDSTON  MD, SCOTT ([id number]) on 06/27/2015 3:39:51 PM      MDM   Final diagnoses:  None    Spoke with neurology after seeing the patient on arrival, and he felt that going straight to MRI would be the best course at this point.  I am concerned about stroke in this patient  4:15 PM- spoke with neurology about the patient and they will be down to evaluate her following the MRI results showing possible acute infarct.  I will speak with the [facility] about the patient as well  Dalia Heading, PA-C 06/27/15 [place], MD 06/28/15 [phone]

## 2015-06-27 NOTE — ED Notes (Signed)
Pt reports to the ED for eval of dizziness and generalized weakness. LSN 2100, 11/25. Pt reports she first noticed the symptoms this am when she woke up at 9 am. She also reports some left sided numbness and tingling. Reports associated nausea. Denies any active vomiting. Pt had 4 mg of Zofran en route. 12 lead shows frequent PVCs. Per EMS stroke screen negative. Hypertensive en route at 180s/100s. No hx HTN. Pt A&OX4, resp e/u, and skin warm and dry.

## 2015-06-28 ENCOUNTER — Inpatient Hospital Stay (HOSPITAL_COMMUNITY): Payer: Medicare Other

## 2015-06-28 DIAGNOSIS — E038 Other specified hypothyroidism: Secondary | ICD-10-CM

## 2015-06-28 DIAGNOSIS — I638 Other cerebral infarction: Secondary | ICD-10-CM

## 2015-06-28 DIAGNOSIS — I639 Cerebral infarction, unspecified: Secondary | ICD-10-CM

## 2015-06-28 DIAGNOSIS — E785 Hyperlipidemia, unspecified: Secondary | ICD-10-CM

## 2015-06-28 LAB — LIPID PANEL
Cholesterol: 210 mg/dL — ABNORMAL HIGH (ref 0–200)
HDL: 67 mg/dL (ref >40)
LDL CALC: 125 mg/dL — AB (ref 0–99)
Total CHOL/HDL Ratio: 3.1 RATIO
Triglycerides: 88 mg/dL (ref <150)
VLDL: 18 mg/dL (ref 0–40)

## 2015-06-28 LAB — TSH: TSH: 3.86 u[IU]/mL (ref 0.350–4.500)

## 2015-06-28 MED ORDER — ATORVASTATIN CALCIUM 20 MG PO TABS
20.0000 mg | ORAL_TABLET | Freq: Every day | ORAL | Status: DC
  Administered 2015-06-28: 20 mg via ORAL
  Filled 2015-06-28: qty 1

## 2015-06-28 NOTE — Progress Notes (Addendum)
PROGRESS NOTE  Kathy Franklin [id number] DOB: 08/19/1926 DOA: 06/27/2015 PCP: Precious Reel, MD Brief History 81 year old female with a history of hypothyroidism, anxiety, CLL presented with one-day history of lightheadedness, dizziness, left arm and left leg numbness and tingling. She also described her left leg as "feeling heavy". The patient denied any orthopnea difficulties, dysarthria, visual disturbance, focal extremity weakness. Workup including MRI of the brain revealed acute right frontal lobe infarct. The patient was admitted for further workup and neurology was consulted.  Assessment/Plan: Acute ischemic stroke -06/27/2015 MRI brain acute right frontal lobe infarct with chronic lacunar infarcts bilateral basal ganglia -Hemoglobin A1c -LDL 125 -cartoid duplex -Echo -Continue aspirin -PT/OT -appreciate neurology followup CLL -follows Dr. Lindi Adie -Status post 6 cycles of fludarabine and Rituxan administered from October 20, 2008 through March 16, 2009 -No evidence of recurrent disease--currently under observation Hypertension -Patient is not on any blood pressure medications secondary to previous orthostasis symptoms Hypothyroidism -Continue Synthroid -Check TSH--3.860 Dyslipidemia -LDL 125 -Start statin in a setting of stroke Dizziness -likely due to stroke -check orthostatics   Family Communication:   Pt at beside Disposition Plan:   Home when medically stable        Procedures/Studies: Ct Head Wo Contrast  06/28/2015  CLINICAL DATA:  Patient had dizziness and unable to walk yesterday, no new complaints today. Follow up CVA EXAM: CT HEAD WITHOUT CONTRAST TECHNIQUE: Contiguous axial images were obtained from the base of the skull through the vertex without intravenous contrast. COMPARISON:  Head CT 01/2014, brain MRI 06/27/2015 FINDINGS: No acute intracranial hemorrhage. No focal mass lesion. No CT evidence of acute infarction. No midline shift or  mass effect. No hydrocephalus. Basilar cisterns are patent. Generalized cortical atrophy unchanged. Mild periventricular white matter hypodensities. Paranasal sinuses and  mastoid air cells are clear. IMPRESSION: 1. No acute intracranial findings. 2. Atrophy and white matter microvascular disease. Electronically Signed   By: Suzy Bouchard M.D.   On: 06/28/2015 12:23   Mr Brain Wo Contrast  06/27/2015  CLINICAL DATA:  Left-sided weakness and numbness and tingling. Dizziness. EXAM: MRI HEAD WITHOUT CONTRAST TECHNIQUE: Multiplanar, multiecho pulse sequences of the brain and surrounding structures were obtained without intravenous contrast. COMPARISON:  Head CT 02/17/2014 FINDINGS: There is a 6 mm focus of cortical/subcortical signal abnormality in the high posterior right frontal lobe on axial diffusion weighted images with suggestion of subtly restricted diffusion on the ADC map. This is not confirmed on coronal diffusion images. There is no other evidence of acute infarct. No intracranial hemorrhage, mass, midline shift, or extra-axial fluid collection is seen. There is mild generalized cerebral atrophy. Small foci of T2 hyperintensity scattered throughout the subcortical and deep cerebral white matter bilaterally are nonspecific but compatible with mild chronic small vessel ischemic disease. There are chronic lacunar infarcts at the posterior aspects of both putamina. Prior bilateral cataract extraction is noted. Paranasal sinuses and mastoid air cells are clear. Major intracranial vascular flow voids are preserved. Left vertebral artery is dominant. IMPRESSION: 1. Possible 6 mm acute infarct in the posterior right frontal lobe. 2. Mild chronic small vessel ischemic disease and chronic bilateral basal ganglia lacunar infarcts. Electronically Signed   By: Logan Bores M.D.   On: 06/27/2015 15:59   Mr Jodene Nam Head/brain Wo Cm  06/27/2015  CLINICAL DATA:  New onset left-sided weakness and numbness. Possible  small acute posterior right frontal lobe infarct on MRI. EXAM: MRA HEAD WITHOUT CONTRAST TECHNIQUE: Angiographic images  of the Circle of Willis were obtained using MRA technique without intravenous contrast. COMPARISON:  None. FINDINGS: The study is mildly to moderately degraded by motion artifact. The visualized distal left vertebral artery is patent and dominant, supplying the basilar artery. The right vertebral artery likely ends in PICA. Left AICA and bilateral SCA origins are patent. Basilar artery is tortuous and widely patent. There is a fetal type origin of the left PCA. There is the suggestion of mild to moderate stenosis of the distal left posterior communicating artery, however this appearance may be exaggerated by motion artifact through this level. There is mild irregularity of the more distal left PCA, and there is mild-to-moderate irregularity of the right PCA with mild P2 narrowing but no high-grade proximal stenosis. Internal carotid arteries are patent from skullbase to carotid termini without evidence of significant stenosis. The right M1 segment is widely patent proximally, however evaluation of the distal M1 segment and right MCA bifurcation is limited by motion as well as artifact at the slab interface and underlying stenosis cannot be adequately assessed for. The left M1 segment is widely patent. Left MCA bifurcation is patent, although evaluation is also limited by motion. No major branch vessel occlusion is identified. There is mild-to-moderate bilateral MCA branch vessel irregularity. The right A1 segment is dominant. ACAs are patent without significant proximal stenosis. No sizable intracranial aneurysm is identified. IMPRESSION: 1. Motion degraded examination without evidence of large vessel occlusion or flow limiting proximal stenosis. 2. Limited evaluation of the right MCA bifurcation. 3. Mild-to-moderate PCA narrowing and MCA branch vessel irregularity suggestive of atherosclerosis as  above. Electronically Signed   By: Logan Bores M.D.   On: 06/27/2015 20:44         Subjective:  patient states that her numbness and tingling in her left upper extremity and lower extremity have improved but her left leg still feels heavy. She has intermittent headache without any visual disturbance, nausea, vomiting, diarrhea, abdominal pain. No chest pain or shortness breath.  Objective: Filed Vitals:   06/28/15 0004 06/28/15 0208 06/28/15 0445 06/28/15 0601  BP: 115/75 123/72 163/68 153/73  Pulse: 57 59 54 55  Temp: 97.7 F (36.5 C)  97.6 F (36.4 C) 98.1 F (36.7 C)  TempSrc: Oral  Oral Oral  Resp: 18 18 18 20   Height:      Weight:      SpO2: 97% 100% 98% 98%    Intake/Output Summary (Last 24 hours) at 06/28/15 1325 Last data filed at 06/28/15 1000  Gross per 24 hour  Intake 1163.75 ml  Output      0 ml  Net 1163.75 ml   Weight change:  Exam:   General:  Pt is alert, follows commands appropriately, not in acute distress  HEENT: No icterus, No thrush, No neck mass, [place]/AT  Cardiovascular: RRR, S1/S2, no rubs, no gallops  Respiratory: CTA bilaterally, no wheezing, no crackles, no rhonchi  Abdomen: Soft/+BS, non tender, non distended, no guarding; no hepatosplenomegaly   Extremities: No edema, No lymphangitis, No petechiae, No rashes, no synovitis; no cyanosis or clubbing   Data Reviewed: Basic Metabolic Panel:  Recent Labs Lab 06/27/15 1319  NA 138  K 4.0  CL 104  CO2 25  GLUCOSE 113*  BUN 23*  CREATININE 0.86  CALCIUM 9.2   Liver Function Tests:  Recent Labs Lab 06/27/15 1319  AST 24  ALT 19  ALKPHOS 38  BILITOT 0.5  PROT 6.2*  ALBUMIN 4.4   No results for  input(s): LIPASE, AMYLASE in the last 168 hours. No results for input(s): AMMONIA in the last 168 hours. CBC:  Recent Labs Lab 06/27/15 1319  WBC 5.7  NEUTROABS 4.4  HGB 13.6  HCT 41.8  MCV 94.6  PLT 156   Cardiac Enzymes: No results for input(s): CKTOTAL, CKMB,  CKMBINDEX, TROPONINI in the last 168 hours. BNP: Invalid input(s): POCBNP CBG: No results for input(s): GLUCAP in the last 168 hours.  No results found for this or any previous visit (from the past 240 hour(s)).   Scheduled Meds: . aspirin  300 mg Rectal Daily   Or  . aspirin  325 mg Oral Daily  . cholecalciferol  1,000 Units Oral q morning - 10a  . enoxaparin (LOVENOX) injection  40 mg Subcutaneous Q24H  . levothyroxine  88 mcg Oral QAC breakfast   Continuous Infusions: . sodium chloride 75 mL/hr at 06/28/15 1216     Marisah Laker, DO  [facility] Pager [phone]  If 7PM-7AM, please contact night-coverage [email or web address] Password [email or web address] 06/28/2015, 1:25 PM   LOS: 1 day

## 2015-06-28 NOTE — Progress Notes (Signed)
SLP Cancellation Note  Patient Details Name: Kathy Franklin MRN: [id number] DOB: 1927/05/27   Cancelled treatment:       Reason Eval/Treat Not Completed: Unable to complete evaluation d/t time constraints/census   ADAMS,PAT, M.S., CCC-SLP 06/28/2015, 1:16 PM

## 2015-06-28 NOTE — Progress Notes (Signed)
STROKE TEAM PROGRESS NOTE   HISTORY Kathy Franklin is an 81 y.o. female with a history of CLL, arthritis and hypothyroidism, presenting with new onset left side weakness and numbness. She was last known well at 9 AM today. There's no previous history of stroke nor TIA. She has not been on antiplatelet therapy. MRI showed a 6 mm acute infarction involving posterior right frontal lobe. NIH score was 2 for sensory changes and mild facial weakness on the left.  LSN: 9 AM on 06/27/2015 tPA Given: No: Minimal residual deficits mRankin:   SUBJECTIVE (INTERVAL HISTORY) Her family is at bedside. Discussed with family. Patient was not on ASA previously, discussed stroke prevention and daily aspirin.   OBJECTIVE Temp:  [97.4 F (36.3 C)-98.1 F (36.7 C)] 98.1 F (36.7 C) (11/27 0601) Pulse Rate:  [54-119] 55 (11/27 0601) Cardiac Rhythm:  [-] Sinus bradycardia (11/27 0703) Resp:  [12-21] 20 (11/27 0601) BP: (115-192)/(64-99) 153/73 mmHg (11/27 0601) SpO2:  [93 %-100 %] 98 % (11/27 0601) Weight:  [51.6 kg (113 lb 12.1 oz)] 51.6 kg (113 lb 12.1 oz) (11/26 1819)  CBC:  Recent Labs Lab 06/27/15 1319  WBC 5.7  NEUTROABS 4.4  HGB 13.6  HCT 41.8  MCV 94.6  PLT [id number]    Basic Metabolic Panel:  Recent Labs Lab 06/27/15 1319  NA 138  K 4.0  CL 104  CO2 25  GLUCOSE 113*  BUN 23*  CREATININE 0.86  CALCIUM 9.2    Lipid Panel:    Component Value Date/Time   CHOL 210* 06/28/2015 0438   TRIG 88 06/28/2015 0438   HDL 67 06/28/2015 0438   CHOLHDL 3.1 06/28/2015 0438   VLDL 18 06/28/2015 0438   LDLCALC 125* 06/28/2015 0438   HgbA1c: No results found for: HGBA1C Urine Drug Screen: No results found for: LABOPIA, COCAINSCRNUR, LABBENZ, AMPHETMU, THCU, LABBARB    IMAGING  Mr Brain Wo Contrast 06/27/2015   1. Possible 6 mm acute infarct in the posterior right frontal lobe.  2. Mild chronic small vessel ischemic disease and chronic bilateral basal ganglia lacunar infarcts.    Mr Jodene Nam  Head/brain Wo Cm 06/27/2015   1. Motion degraded examination without evidence of large vessel occlusion or flow limiting proximal stenosis.  2. Limited evaluation of the right MCA bifurcation.  3. Mild-to-moderate PCA narrowing and MCA branch vessel irregularity suggestive of atherosclerosis as above.     Physical exam: Exam: Gen: NAD, conversant, well nourised, obese, well groomed                     CV: RRR, no MRG. No Carotid Bruits. No peripheral edema, warm, nontender Eyes: Conjunctivae clear without exudates or hemorrhage  Neuro: Detailed Neurologic Exam  Speech:    Speech is normal; fluent and spontaneous with normal comprehension.  Cognition:    The patient is oriented to person, place, and time;     recent and remote memory intact;  Cranial Nerves:    The pupils are equal, round, and reactive to light. Visual fields are full to finger confrontation. Extraocular movements are intact. Trigeminal sensation is intact and the muscles of mastication are normal. The face is symmetric. The palate elevates in the midline. Hearing intact. Voice is normal. Shoulder shrug is normal. The tongue has normal motion without fasciculations.    Motor Observation:    No asymmetry, no atrophy, and no involuntary movements noted. Tone:    Normal muscle tone.    Posture:  Posture is normal. normal erect    Strength:    Strength is V/V in the upper and lower limbs.      Sensation: Denies left-sided sensory changes today to LT      Reflex Exam:  Toes:    The toes are downgoing bilaterally.   Clonus:    Clonus is absent.   ASSESSMENT/PLAN [name] is a 81 y.o. female with history of CLL, arthritis, and hypothyroidism presenting with new onset left-sided numbness and weakness. She did not receive IV t-PA due to minimal deficits.  Possible stroke:  Non-dominant infarct possibly embolic from an unknown source.  Resultant  Resolution of symptoms  MRI  Possible 6 mm acute  infarct in the posterior right frontal lobe.   MRA  Motion degraded examination without evidence of large vessel occlusion or flow limiting proximal stenosis.   Carotid Doppler - preliminary report - Bilateral: 1-39% ICA stenosis. Vertebral artery flow is antegrade.  2D Echo - pending  LDL - 125  HgbA1c pending  VTE prophylaxis - Lovenox  Diet Heart Room service appropriate?: Yes; Fluid consistency:: Thin  No antithrombotic prior to admission, now on aspirin 325 mg daily  Patient counseled to be compliant with her antithrombotic medications  Ongoing aggressive stroke risk factor management  Therapy recommendations: No follow-up occupational therapy recommended.  Disposition:  Pending  Hypertension  Stable  Permissive hypertension (OK if < 220/120) but gradually normalize in 5-7 days  Hyperlipidemia  Home meds:  No lipid lowering medications prior to admission  LDL 125, goal < 70  Now on Lipitor 20 mg daily  Continue statin at discharge    Other Stroke Risk Factors  Advanced age   Other Active Problems  Mildly elevated BUN - possible dehydration  Hospital day # 1  Personally examined patient and images, personally conducted neuro exam,documented assessment and plan as stated above. I have personally obtained the history, evaluated lab data, reviewed imaging studies and agree with radiology interpretations.    Sarina Ill, MD Stroke Neurology [phone] [facility]    To contact Stroke Continuity provider, please refer to [email or web address] After hours, contact General Neurology

## 2015-06-28 NOTE — Discharge Summary (Signed)
Physician Discharge Summary  Kathy Franklin [id number] DOB: 12/15/1926 DOA: 06/27/2015  PCP: Precious Reel, MD  Admit date: 06/27/2015 Discharge date: 06/29/15 Recommendations for Outpatient Follow-up:  1. Pt will need to follow up with PCP in 2 weeks post discharge  Discharge Diagnoses:  Acute ischemic stroke -06/27/2015 MRI brain acute right frontal lobe infarct with chronic lacunar infarcts bilateral basal ganglia -MRA brain negative for hemodynamically significant stenosis although there was mild to moderate PCA narrowing and MCA branch vessel irregularity -Hemoglobin A1c--5.7 -LDL 125 -cartoid duplex--negative for hemodynamically significant stenosis -Echo--EF 50-55%, no PFO, no thrombi -Continue aspirin 325mg  daily -PT/OT--No followup needed -appreciate neurology followup CLL -follows Dr. Lindi Adie -Status post 6 cycles of fludarabine and Rituxan administered from October 20, 2008 through March 16, 2009 -No evidence of recurrent disease--currently under observation Hypertension -Patient is not on any blood pressure medications secondary to previous orthostasis symptoms Hypothyroidism -Continue Synthroid -Check TSH--3.860 -follow up with Dr. Virgina Jock Dyslipidemia -LDL 125 -Start lipitor 20 mg in a setting of stroke Dizziness -likely due to stroke -check orthostatics--negative  Discharge Condition: stable  Disposition: home  Diet:heart healthy Wt Readings from Last 3 Encounters:  06/27/15 51.6 kg (113 lb 12.1 oz)  05/28/15 50.032 kg (110 lb 4.8 oz)  07/23/14 47.5 kg (104 lb 11.5 oz)    History of present illness:  81 year old female with a history of hypothyroidism, anxiety, CLL presented with one-day history of lightheadedness, dizziness, left arm and left leg numbness and tingling. She also described her left leg as "feeling heavy". The patient denied any orthopnea difficulties, dysarthria, visual disturbance, focal extremity weakness. Workup including MRI of the  brain revealed acute right frontal lobe infarct. The patient was admitted for further workup and neurology was consulted.  Full stroke workup was undertaken.  Carotid duplex and MRA of the brain were negative for any hemodynamically sniffed and stenosis.  Echo neg for PFO or thrombi.  PT rec no followup.  Home with ASA and lipitor Consultants: Neurology  Discharge Exam: Filed Vitals:   06/28/15 1429 06/28/15 1431  BP: 145/94 141/71  Pulse: 68 75  Temp:    Resp:     Filed Vitals:   06/28/15 0601 06/28/15 1427 06/28/15 1429 06/28/15 1431  BP: 153/73 147/78 145/94 141/71  Pulse: 55 61 68 75  Temp: 98.1 F (36.7 C)     TempSrc: Oral     Resp: 20     Height:      Weight:      SpO2: 98% 95% 98% 97%   General: A&O x 3, NAD, pleasant, cooperative Cardiovascular: RRR, no rub, no gallop, no S3 Respiratory: CTAB, no wheeze, no rhonchi Abdomen:soft, nontender, nondistended, positive bowel sounds Extremities: No edema, No lymphangitis, no petechiae  Discharge Instructions     Medication List    ASK your doctor about these medications        azithromycin 250 MG tablet  Commonly known as:  ZITHROMAX Z-PAK  Zpak Use as directed     cholecalciferol 1000 UNITS tablet  Commonly known as:  VITAMIN D  Take 1,000 Units by mouth every morning.     GARLIC PO  Take 1 tablet by mouth daily.     KRILL OIL PLUS Caps  Take 1 capsule by mouth daily.     levothyroxine 88 MCG tablet  Commonly known as:  SYNTHROID, LEVOTHROID  Take 88 mcg by mouth daily before breakfast.     LORazepam 0.5 MG tablet  Commonly known as:  ATIVAN  Take 1 tablet (0.5 mg total) by mouth every 12 (twelve) hours as needed for anxiety.     polyethylene glycol packet  Commonly known as:  MIRALAX / GLYCOLAX  Take 17 g by mouth daily as needed for mild constipation.         The results of significant diagnostics from this hospitalization (including imaging, microbiology, ancillary and laboratory) are listed  below for reference.    Significant Diagnostic Studies: Ct Head Wo Contrast  06/28/2015  CLINICAL DATA:  Patient had dizziness and unable to walk yesterday, no new complaints today. Follow up CVA EXAM: CT HEAD WITHOUT CONTRAST TECHNIQUE: Contiguous axial images were obtained from the base of the skull through the vertex without intravenous contrast. COMPARISON:  Head CT 01/2014, brain MRI 06/27/2015 FINDINGS: No acute intracranial hemorrhage. No focal mass lesion. No CT evidence of acute infarction. No midline shift or mass effect. No hydrocephalus. Basilar cisterns are patent. Generalized cortical atrophy unchanged. Mild periventricular white matter hypodensities. Paranasal sinuses and  mastoid air cells are clear. IMPRESSION: 1. No acute intracranial findings. 2. Atrophy and white matter microvascular disease. Electronically Signed   By: Suzy Bouchard M.D.   On: 06/28/2015 12:23   Mr Brain Wo Contrast  06/27/2015  CLINICAL DATA:  Left-sided weakness and numbness and tingling. Dizziness. EXAM: MRI HEAD WITHOUT CONTRAST TECHNIQUE: Multiplanar, multiecho pulse sequences of the brain and surrounding structures were obtained without intravenous contrast. COMPARISON:  Head CT 02/17/2014 FINDINGS: There is a 6 mm focus of cortical/subcortical signal abnormality in the high posterior right frontal lobe on axial diffusion weighted images with suggestion of subtly restricted diffusion on the ADC map. This is not confirmed on coronal diffusion images. There is no other evidence of acute infarct. No intracranial hemorrhage, mass, midline shift, or extra-axial fluid collection is seen. There is mild generalized cerebral atrophy. Small foci of T2 hyperintensity scattered throughout the subcortical and deep cerebral white matter bilaterally are nonspecific but compatible with mild chronic small vessel ischemic disease. There are chronic lacunar infarcts at the posterior aspects of both putamina. Prior bilateral  cataract extraction is noted. Paranasal sinuses and mastoid air cells are clear. Major intracranial vascular flow voids are preserved. Left vertebral artery is dominant. IMPRESSION: 1. Possible 6 mm acute infarct in the posterior right frontal lobe. 2. Mild chronic small vessel ischemic disease and chronic bilateral basal ganglia lacunar infarcts. Electronically Signed   By: Logan Bores M.D.   On: 06/27/2015 15:59   Mr Jodene Nam Head/brain Wo Cm  06/27/2015  CLINICAL DATA:  New onset left-sided weakness and numbness. Possible small acute posterior right frontal lobe infarct on MRI. EXAM: MRA HEAD WITHOUT CONTRAST TECHNIQUE: Angiographic images of the Circle of Willis were obtained using MRA technique without intravenous contrast. COMPARISON:  None. FINDINGS: The study is mildly to moderately degraded by motion artifact. The visualized distal left vertebral artery is patent and dominant, supplying the basilar artery. The right vertebral artery likely ends in PICA. Left AICA and bilateral SCA origins are patent. Basilar artery is tortuous and widely patent. There is a fetal type origin of the left PCA. There is the suggestion of mild to moderate stenosis of the distal left posterior communicating artery, however this appearance may be exaggerated by motion artifact through this level. There is mild irregularity of the more distal left PCA, and there is mild-to-moderate irregularity of the right PCA with mild P2 narrowing but no high-grade proximal stenosis. Internal carotid arteries are patent from skullbase to carotid termini  without evidence of significant stenosis. The right M1 segment is widely patent proximally, however evaluation of the distal M1 segment and right MCA bifurcation is limited by motion as well as artifact at the slab interface and underlying stenosis cannot be adequately assessed for. The left M1 segment is widely patent. Left MCA bifurcation is patent, although evaluation is also limited by  motion. No major branch vessel occlusion is identified. There is mild-to-moderate bilateral MCA branch vessel irregularity. The right A1 segment is dominant. ACAs are patent without significant proximal stenosis. No sizable intracranial aneurysm is identified. IMPRESSION: 1. Motion degraded examination without evidence of large vessel occlusion or flow limiting proximal stenosis. 2. Limited evaluation of the right MCA bifurcation. 3. Mild-to-moderate PCA narrowing and MCA branch vessel irregularity suggestive of atherosclerosis as above. Electronically Signed   By: Logan Bores M.D.   On: 06/27/2015 20:44     Microbiology: No results found for this or any previous visit (from the past 240 hour(s)).   Labs: Basic Metabolic Panel:  Recent Labs Lab 06/27/15 1319  NA 138  K 4.0  CL 104  CO2 25  GLUCOSE 113*  BUN 23*  CREATININE 0.86  CALCIUM 9.2   Liver Function Tests:  Recent Labs Lab 06/27/15 1319  AST 24  ALT 19  ALKPHOS 38  BILITOT 0.5  PROT 6.2*  ALBUMIN 4.4   No results for input(s): LIPASE, AMYLASE in the last 168 hours. No results for input(s): AMMONIA in the last 168 hours. CBC:  Recent Labs Lab 06/27/15 1319  WBC 5.7  NEUTROABS 4.4  HGB 13.6  HCT 41.8  MCV 94.6  PLT 156   Cardiac Enzymes: No results for input(s): CKTOTAL, CKMB, CKMBINDEX, TROPONINI in the last 168 hours. BNP: Invalid input(s): POCBNP CBG: No results for input(s): GLUCAP in the last 168 hours.  Time coordinating discharge:  Greater than 30 minutes  Signed:  Cornelious Bartolucci, DO [facility] Pager: [phone] 06/28/2015, 5:14 PM

## 2015-06-28 NOTE — Evaluation (Signed)
Occupational Therapy Evaluation Patient Details Name: Kathy Franklin MRN: [id number] DOB: 1926-11-05 Today's Date: 06/28/2015    History of Present Illness 81 year old female with a history of hypothyroidism, anxiety, CLL presented with one-day history of lightheadedness, dizziness, left arm and left leg numbness and tingling. She also described her left leg as "feeling heavy". The patient denied any orthopnea difficulties, dysarthria, visual disturbance, focal extremity weakness. Workup including MRI of the brain revealed acute right frontal lobe infarct. The patient was admitted for further workup and neurology was consulted.   Clinical Impression   Patient admitted with above. Patient independent PTA. Patient currently functioning at an overall mod I level taking increased time.  No additional OT needs identified, D/C from acute OT services and no additional follow-up OT needs at this time. All appropriate education provided to patient and family (daughters). Please re-order OT if needed.      Follow Up Recommendations  No OT follow up;Supervision - Intermittent    Equipment Recommendations  None recommended by OT    Recommendations for Other Services  None at this time   Precautions / Restrictions Precautions Precautions: Fall Restrictions Weight Bearing Restrictions: No      Mobility Bed Mobility Overal bed mobility: Modified Independent General bed mobility comments: increased time  Transfers Overall transfer level: Modified independent General transfer comment: increased time    Balance Overall balance assessment: No apparent balance deficits (not formally assessed)    ADL Overall ADL's : Modified independent;At baseline General ADL Comments: increased time    Vision Vision Assessment?: No apparent visual deficits          Pertinent Vitals/Pain Pain Assessment: No/denies pain     Hand Dominance Right   Extremity/Trunk Assessment Upper Extremity  Assessment Upper Extremity Assessment: Overall WFL for tasks assessed   Lower Extremity Assessment Lower Extremity Assessment: Overall WFL for tasks assessed   Cervical / Trunk Assessment Cervical / Trunk Assessment: Normal   Communication Communication Communication: No difficulties   Cognition Arousal/Alertness: Awake/alert Behavior During Therapy: WFL for tasks assessed/performed Overall Cognitive Status: Within Functional Limits for tasks assessed (a bit impulsive and quick on her feet, daughters report this is baseline for patient)              Home Living Family/patient expects to be discharged to:: Private residence Living Arrangements: Spouse/significant other Available Help at Discharge: Family;Available 24 hours/day Type of Home: House Home Access: Stairs to enter [employer] of Steps: 2    Home Layout: Multi-level (2 steps througout house)     Bathroom Shower/Tub: Hospital doctor Toilet: Handicapped height     Home Equipment: Environmental consultant - 2 wheels;Cane - single point;Shower seat - built in;Grab bars - toilet;Grab bars - tub/shower    Prior Functioning/Environment Level of Independence: Independent     OT Diagnosis: Generalized weakness   OT Problem List:  n/a, no acute OT needs identified    OT Treatment/Interventions:   n/a, no acute OT needs identified    OT Goals(Current goals can be found in the care plan section) Acute Rehab OT Goals Patient Stated Goal: go home! OT Goal Formulation: All assessment and education complete, DC therapy  OT Frequency:  n/a, no acute OT needs identified    Barriers to D/C:  None known at this time    End of Session Equipment Utilized During Treatment: Rolling walker  Activity Tolerance: Patient tolerated treatment well Patient left: in bed;with call bell/phone within reach   Time: 1421-1436 OT Time  Calculation (min): 15 min Charges:  OT General Charges $OT Visit: 1 Procedure OT  Evaluation $Initial OT Evaluation Tier I: 1 Procedure  Brailyn Killion , MS, OTR/L, CLT Pager: [phone]  06/28/2015, 2:45 PM

## 2015-06-28 NOTE — Evaluation (Signed)
Physical Therapy Evaluation Patient Details Name: Kathy Franklin MRN: [id number] DOB: 1927/01/15 Today's Date: 06/28/2015   History of Present Illness  81 year old female with a history of hypothyroidism, anxiety, CLL presented with one-day history of lightheadedness, dizziness, left arm and left leg numbness and tingling. She also described her left leg as "feeling heavy".  MRI of the brain revealed acute right frontal lobe infarct.  NIHSS score = 2  Clinical Impression  Patient is independent with all mobility and gait.  Provided education on warning signs of stroke, and risk factors.  No further PT needs identified - PT will sign off.    Follow Up Recommendations No PT follow up    Equipment Recommendations  None recommended by PT    Recommendations for Other Services       Precautions / Restrictions Precautions Precautions: None Restrictions Weight Bearing Restrictions: No      Mobility  Bed Mobility Overal bed mobility: Modified Independent             General bed mobility comments: increased time  Transfers Overall transfer level: Independent               General transfer comment: increased time  Ambulation/Gait Ambulation/Gait assistance: Independent Ambulation Distance (Feet): 200 Feet Assistive device: None Gait Pattern/deviations: WFL(Within Functional Limits)   Gait velocity interpretation: at or above normal speed for age/gender General Gait Details: Patient with good gait pattern, balance, and speed  Stairs            Wheelchair Mobility    Modified Rankin (Stroke Patients Only) Modified Rankin (Stroke Patients Only) Pre-Morbid Rankin Score: No symptoms Modified Rankin: No symptoms     Balance Overall balance assessment: Independent                           High level balance activites: Direction changes;Turns;Sudden stops;Head turns (Stepping over and around obstacles) High Level Balance Comments: No loss of  balance during high level balance activities             Pertinent Vitals/Pain Pain Assessment: No/denies pain    Home Living Family/patient expects to be discharged to:: Private residence Living Arrangements: Spouse/significant other Available Help at Discharge: Family;Available 24 hours/day Type of Home: House Home Access: Stairs to enter   [employer] of Steps: 2  Home Layout: Multi-level (2 steps throughout house) Home Equipment: Walker - 2 wheels;Cane - single point;Shower seat - built in;Grab bars - toilet;Grab bars - tub/shower      Prior Function Level of Independence: Independent         Comments: Very active.     Hand Dominance   Dominant Hand: Right    Extremity/Trunk Assessment   Upper Extremity Assessment: Overall WFL for tasks assessed           Lower Extremity Assessment: Overall WFL for tasks assessed (Strength symmetrical)      Cervical / Trunk Assessment: Normal  Communication   Communication: No difficulties  Cognition Arousal/Alertness: Awake/alert Behavior During Therapy: WFL for tasks assessed/performed;Restless;Impulsive (Somewhat impulsive at baseline) Overall Cognitive Status: Within Functional Limits for tasks assessed                      General Comments      Exercises        Assessment/Plan    PT Assessment Patent does not need any further PT services  PT Diagnosis Abnormality of gait   PT  Problem List    PT Treatment Interventions     PT Goals (Current goals can be found in the Care Plan section) Acute Rehab PT Goals Patient Stated Goal: go home! PT Goal Formulation: All assessment and education complete, DC therapy    Frequency     Barriers to discharge        Co-evaluation               End of Session   Activity Tolerance: Patient tolerated treatment well Patient left: in bed;with call bell/phone within reach;with family/visitor present Nurse Communication: Mobility status  (Ready for d/c from PT perspective)         Time: 1545-1600 PT Time Calculation (min) (ACUTE ONLY): 15 min   Charges:   PT Evaluation $Initial PT Evaluation Tier I: 1 Procedure     PT G CodesDespina Pole July 23, 2015, 4:31 PM Carita Pian. Sanjuana Kava, [place] Pager [phone]

## 2015-06-28 NOTE — Progress Notes (Signed)
VASCULAR LAB PRELIMINARY  PRELIMINARY  PRELIMINARY  PRELIMINARY  Carotid duplex completed.    Preliminary report:  Bilateral:  1-39% ICA stenosis.  Vertebral artery flow is antegrade.  No significant change since study of 06/2014   Toma Copier, RVS 06/28/2015, 3:21 PM

## 2015-06-29 ENCOUNTER — Inpatient Hospital Stay (HOSPITAL_COMMUNITY): Payer: Medicare Other

## 2015-06-29 DIAGNOSIS — I6789 Other cerebrovascular disease: Secondary | ICD-10-CM

## 2015-06-29 LAB — COMPREHENSIVE METABOLIC PANEL
ALBUMIN: 3.5 g/dL (ref 3.5–5.0)
ALK PHOS: 33 U/L — AB (ref 38–126)
ALT: 14 U/L (ref 14–54)
ANION GAP: 8 (ref 5–15)
AST: 19 U/L (ref 15–41)
BILIRUBIN TOTAL: 0.7 mg/dL (ref 0.3–1.2)
BUN: 12 mg/dL (ref 6–20)
CO2: 24 mmol/L (ref 22–32)
Calcium: 8.4 mg/dL — ABNORMAL LOW (ref 8.9–10.3)
Chloride: 109 mmol/L (ref 101–111)
Creatinine, Ser: 0.71 mg/dL (ref 0.44–1.00)
GFR calc Af Amer: >60 mL/min (ref >60)
GFR calc non Af Amer: >60 mL/min (ref >60)
GLUCOSE: 86 mg/dL (ref 65–99)
POTASSIUM: 3.9 mmol/L (ref 3.5–5.1)
SODIUM: 141 mmol/L (ref 135–145)
TOTAL PROTEIN: 5.1 g/dL — AB (ref 6.5–8.1)

## 2015-06-29 LAB — HEMOGLOBIN A1C
Hgb A1c MFr Bld: 5.7 % — ABNORMAL HIGH (ref 4.8–5.6)
MEAN PLASMA GLUCOSE: 117 mg/dL

## 2015-06-29 LAB — TSH: TSH: 4.792 u[IU]/mL — ABNORMAL HIGH (ref 0.350–4.500)

## 2015-06-29 MED ORDER — ASPIRIN 325 MG PO TABS: 325.0000 mg | ORAL_TABLET | Freq: Every day | ORAL | Status: DC

## 2015-06-29 MED ORDER — ATORVASTATIN CALCIUM 20 MG PO TABS: 20.0000 mg | ORAL_TABLET | Freq: Every day | ORAL | Status: AC

## 2015-06-29 NOTE — Progress Notes (Signed)
  Echocardiogram 2D Echocardiogram has been performed.  Jennette Dubin 06/29/2015, 8:46 AM

## 2015-06-29 NOTE — Progress Notes (Signed)
STROKE TEAM PROGRESS NOTE   HISTORY Kathy Franklin is an 81 y.o. female with a history of CLL, arthritis and hypothyroidism, presenting with new onset left side weakness and numbness. She was last known well at 9 AM today. There's no previous history of stroke nor TIA. She has not been on antiplatelet therapy. MRI showed a 6 mm acute infarction involving posterior right frontal lobe. NIH score was 2 for sensory changes and mild facial weakness on the left.  LSN: 9 AM on 06/27/2015 tPA Given: No: Minimal residual deficits mRankin:   SUBJECTIVE (INTERVAL HISTORY)  Patient is neurologically stable. No recurrence of symptoms.   OBJECTIVE Temp:  [97.5 F (36.4 C)-98.6 F (37 C)] 97.5 F (36.4 C) (11/28 0551) Pulse Rate:  [52-75] 53 (11/28 0551) Cardiac Rhythm:  [-] Normal sinus rhythm (11/28 0700) Resp:  [15-16] 16 (11/28 0551) BP: (138-147)/(64-94) 138/77 mmHg (11/28 0551) SpO2:  [95 %-99 %] 99 % (11/28 0551)  CBC:   Recent Labs Lab 06/27/15 1319  WBC 5.7  NEUTROABS 4.4  HGB 13.6  HCT 41.8  MCV 94.6  PLT [id number]    Basic Metabolic Panel:   Recent Labs Lab 06/27/15 1319 06/29/15 0613  NA 138 141  K 4.0 3.9  CL 104 109  CO2 25 24  GLUCOSE 113* 86  BUN 23* 12  CREATININE 0.86 0.71  CALCIUM 9.2 8.4*    Lipid Panel:     Component Value Date/Time   CHOL 210* 06/28/2015 0438   TRIG 88 06/28/2015 0438   HDL 67 06/28/2015 0438   CHOLHDL 3.1 06/28/2015 0438   VLDL 18 06/28/2015 0438   LDLCALC 125* 06/28/2015 0438   HgbA1c:  Lab Results  Component Value Date   HGBA1C 5.7* 06/28/2015   Urine Drug Screen: No results found for: LABOPIA, COCAINSCRNUR, LABBENZ, AMPHETMU, THCU, LABBARB    IMAGING  Mr Brain Wo Contrast 06/27/2015   1. Possible 6 mm acute infarct in the posterior right frontal lobe.  2. Mild chronic small vessel ischemic disease and chronic bilateral basal ganglia lacunar infarcts.   Mr Jodene Nam Head/brain Wo Cm 06/27/2015   1. Motion degraded  examination without evidence of large vessel occlusion or flow limiting proximal stenosis.  2. Limited evaluation of the right MCA bifurcation.  3. Mild-to-moderate PCA narrowing and MCA branch vessel irregularity suggestive of atherosclerosis as above.    Physical exam: Gen: NAD, conversant, well nourised, obese, well groomed                     CV: RRR, no MRG. No Carotid Bruits. No peripheral edema, warm, nontender Eyes: Conjunctivae clear without exudates or hemorrhage  Neuro: Detailed Neurologic Exam  Speech:    Speech is normal; fluent and spontaneous with normal comprehension.  Cognition:    The patient is oriented to person, place, and time;     recent and remote memory intact;  Cranial Nerves:    The pupils are equal, round, and reactive to light. Visual fields are full to finger confrontation. Extraocular movements are intact. Trigeminal sensation is intact and the muscles of mastication are normal. The face is symmetric. The palate elevates in the midline. Hearing intact. Voice is normal. Shoulder shrug is normal. The tongue has normal motion without fasciculations.   Motor Observation:    No asymmetry, no atrophy, and no involuntary movements noted. Tone:    Normal muscle tone.    Posture:    Posture is normal. normal erect  Strength:2    Strength is V/V in the upper and lower limbs.      Sensation: Denies left-sided sensory changes today to LT      Reflex Exam:  Toes:    The toes are downgoing bilaterally.   Clonus:    Clonus is absent.   ASSESSMENT/PLAN [name] is a 81 y.o. female with history of CLL, arthritis, and hypothyroidism presenting with new onset left-sided numbness and weakness. She did not receive IV t-PA due to minimal deficits.   Possible stroke:  Non-dominant infarct possibly small vessel disease  Resultant  Resolution of symptoms  MRI  Possible 6 mm acute infarct in the posterior right frontal lobe.   MRA  Motion degraded  examination without evidence of large vessel occlusion or flow limiting proximal stenosis.   Carotid Doppler - Bilateral: 1-39% ICA stenosis. Vertebral artery flow is antegrade.  2D Echo - No source of embolus   LDL - 125  HgbA1c 5.7  VTE prophylaxis - Lovenox Diet Heart Room service appropriate?: Yes; Fluid consistency:: Thin  No antithrombotic prior to admission, now on aspirin 325 mg daily  Patient counseled to be compliant with her antithrombotic medications  Ongoing aggressive stroke risk factor management  Therapy recommendations: No follow-up physical or occupational therapy recommended.  Disposition:  Return home  Stroke service will sign off  Follow up with Dr. Leonie Man in 2 months. Order written  Hypertension  Stable  Hyperlipidemia  Home meds:  No lipid lowering medications prior to admission  LDL 125, goal < 70  Now on Lipitor 20 mg daily  Continue statin at discharge  Other Stroke Risk Factors  Advanced age  Other Active Problems  Mildly elevated BUN - possible dehydration  Hospital day # 2  I have personally examined this patient, reviewed notes, independently viewed imaging studies, participated in medical decision making and plan of care. I have made any additions or clarifications directly to the above note. Agree with note above.  Continue aspirin and statin .Stroke service will sign off. Follow-up in the stroke clinic in 2 months.  Antony Contras, MD Medical Director [facility] Pager: [phone] 06/29/2015 4:30 PM   To contact Stroke Continuity provider, please refer to [email or web address] After hours, contact General Neurology

## 2015-06-29 NOTE — Progress Notes (Signed)
Utilization review completed. Preslynn Bier, RN, BSN. 

## 2015-06-29 NOTE — Care Management Note (Signed)
Case Management Note  Patient Details  Name: Kathy Franklin MRN: [id number] Date of Birth: May 16, 1927  Subjective/Objective:  Patient for dc today, no needs.                  Action/Plan:   Expected Discharge Date:                  Expected Discharge Plan:  Home/Self Care  In-House Referral:     Discharge planning Services  CM Consult  Post Acute Care Choice:    Choice offered to:     DME Arranged:    DME Agency:     HH Arranged:    [place] Agency:     Status of Service:  Completed, signed off  Medicare Important Message Given:    Date Medicare IM Given:    Medicare IM give by:    Date Additional Medicare IM Given:    Additional Medicare Important Message give by:     If discussed at [place] of Stay Meetings, dates discussed:    Additional Comments:  Zenon Mayo, RN 06/29/2015, 4:35 PM

## 2015-06-29 NOTE — Evaluation (Signed)
Speech Language Pathology Evaluation Patient Details Name: Kathy Franklin MRN: [id number] DOB: 18-Nov-1926 Today's Date: 06/29/2015 Time: 1000-1020 SLP Time Calculation (min) (ACUTE ONLY): 20 min  Problem List:  Patient Active Problem List   Diagnosis Date Noted  . Dyslipidemia 06/28/2015  . Cerebral infarction due to unspecified mechanism   . CVA (cerebral infarction) 06/27/2015  . Hypothyroidism 06/27/2015  . Chest pain 07/22/2014  . Pulmonary infiltrates 10/22/2013  . Protein-calorie malnutrition, severe ([place]) 09/10/2013  . CAP (community acquired pneumonia) 09/09/2013  . CLL (chronic lymphocytic leukemia) ([place]) 07/20/2011   Past Medical History:  Past Medical History  Diagnosis Date  . CLL (chronic lymphocytic leukemia) ([place]) 07/20/2011  . Arthritis   . PVC's (premature ventricular contractions)   . Thyroid disease   . Depression    Past Surgical History:  Past Surgical History  Procedure Laterality Date  . Abdominal hysterectomy    . Appendectomy    . Hernia repair     HPI:  81 year old female with a history of hypothyroidism, anxiety, CLL presented with one-day history of lightheadedness, dizziness, left arm and left leg numbness and tingling. She also described her left leg as "feeling heavy". The patient denied any orthopnea difficulties, dysarthria, visual disturbance, focal extremity weakness. Workup including MRI of the brain revealed acute right frontal lobe infarct.   Assessment / Plan / Recommendation Clinical Impression  Cognitive-linguistic evaluation complete.  Patient demonstrates intact speech, receptive and expressive language abilities as well as cognition.  Patient requires extra time and intermittent use of external aids for recall of new information, which she reports is her baseline.  SLP educated patient on effective memory strategies such as use of repetition and question cues as effective strategies to facilitate storage and retrieval of new  information; handout left for carryover.  No further skilled SLP services are warranted at this time.  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  Overall Cognitive Status: Within Advertising copywriter for tasks assessed Arousal/Alertness: Awake/alert Orientation Level: Oriented X4 Attention: Selective Selective Attention: Appears intact Memory: Appears intact (Mod I ) Awareness: Appears intact Problem Solving: Appears intact Safety/Judgment: Appears intact    Comprehension  Auditory Comprehension Overall Auditory Comprehension: Appears within functional limits for tasks assessed    Expression Expression Primary Mode of Expression: Verbal Verbal Expression Overall Verbal Expression: Appears within functional limits for tasks assessed   Oral / Motor Oral Motor/Sensory Function Overall Oral Motor/Sensory Function: Within functional limits Motor Speech Overall Motor Speech: Appears within functional limits for tasks assessed   Carmelia Roller., CCC-SLP [id number]  Kathy Franklin 06/29/2015, 10:31 AM

## 2015-06-29 NOTE — Progress Notes (Signed)
Pharmacist Provided - Patient Medication Education    Kathy Franklin is an 81 y.o. female who presented to [facility] on 06/27/2015 with a chief complaint of  Chief Complaint  Patient presents with  . Dizziness     Allergy Assessment Completed and Updated: [x]  Yes    []  No Identified Patient Allergies:  Allergies  Allergen Reactions  . Penicillins Rash     Medication Adherence Assessment: [x]  Excellent (no doses missed/week)      []  Good (1 dose missed/week)      []  Partial (2-3 doses missed/week)      []  Poor (>3 doses missed/week)  Barriers to Obtaining Medications: []  Yes [x]  No   Assessment: Kathy Franklin knows her medications very well and is aware of the 2 new medications she was told she would be prescribed: aspirin and atorvastatin.   I discussed the potential side effects to look for with these 2 medications and to contact her PCP if she experienced any issues.   The pt is comfortable with her other medications she takes at home. I discussed being cautious when taking the lorazpem for anxiety due to risk of falling.   Time spent preparing for discharge counseling: 10 minutes Time spent counseling patient: 20 minutes  Joya San, PharmD Clinical Pharmacy Resident Pager # [phone] 06/29/2015 2:09 PM

## 2015-06-29 NOTE — Progress Notes (Signed)
NURSING PROGRESS NOTE  ESCARLETH GOWELL [id number] Discharge Data: 06/29/2015 7:57 PM Attending Provider: No att. providers found [id number] Jerilynn Mages, MD     Hilbert Bible to be D/C'd Home per MD order.  Discussed with the patient the After Visit Summary and all questions fully answered. All IV's discontinued with no bleeding noted. All belongings returned to patient for patient to take home.   Last Vital Signs:  Blood pressure 135/65, pulse 58, temperature 97.5 F (36.4 C), temperature source Oral, resp. rate 18, height 5\' 5"  (1.651 m), weight 51.6 kg (113 lb 12.1 oz), SpO2 98 %.  Discharge Medication List   Medication List    STOP taking these medications        azithromycin 250 MG tablet  Commonly known as:  ZITHROMAX Z-PAK     LORazepam 0.5 MG tablet  Commonly known as:  ATIVAN     polyethylene glycol packet  Commonly known as:  MIRALAX / GLYCOLAX      TAKE these medications        aspirin 325 MG tablet  Take 1 tablet (325 mg total) by mouth daily.     atorvastatin 20 MG tablet  Commonly known as:  LIPITOR  Take 1 tablet (20 mg total) by mouth daily at 6 PM.     cholecalciferol 1000 UNITS tablet  Commonly known as:  VITAMIN D  Take 1,000 Units by mouth every morning.     GARLIC PO  Take 1 tablet by mouth daily.     KRILL OIL PLUS Caps  Take 1 capsule by mouth daily.     levothyroxine 88 MCG tablet  Commonly known as:  SYNTHROID, LEVOTHROID  Take 88 mcg by mouth daily before breakfast.         Charolette Child, RN

## 2015-08-28 ENCOUNTER — Encounter: Payer: Self-pay | Admitting: Podiatry

## 2015-08-28 ENCOUNTER — Ambulatory Visit (INDEPENDENT_AMBULATORY_CARE_PROVIDER_SITE_OTHER): Payer: Medicare Other | Admitting: Podiatry

## 2015-08-28 VITALS — BP 105/72 | HR 63 | Resp 16 | Ht 66.0 in | Wt 110.0 lb

## 2015-08-28 DIAGNOSIS — B351 Tinea unguium: Secondary | ICD-10-CM | POA: Diagnosis not present

## 2015-08-28 DIAGNOSIS — M79673 Pain in unspecified foot: Secondary | ICD-10-CM | POA: Diagnosis not present

## 2015-08-28 NOTE — Progress Notes (Signed)
   Subjective:    Patient ID: Kathy Franklin, female    DOB: 11-23-1926, 82 y.o.   MRN: [id number]  HPI this patient presents to the office with chief complaint of painful outside border big toe left foot. She presents the office today denying any drainage or infection noted to the nail. She admits after days activity that she has pain along the outside border big toe left foot.  She admits that she has  had problems with her great toenails that her nails are not attached to her nail bed. She presents the office today asking for an evaluation and treatment of her painful left big toenail    Review of Systems  All other systems reviewed and are negative.      Objective:   Physical Exam GENERAL APPEARANCE: Alert, conversant. Appropriately groomed. No acute distress.  VASCULAR: Pedal pulses palpable at  [facility] and PT bilateral.  Capillary refill time is immediate to all digits,  Normal temperature gradient.  Digital hair growth is present bilateral  NEUROLOGIC: sensation is normal to 5.07 monofilament at 5/5 sites bilateral.  Light touch is intact bilateral, Muscle strength normal.  MUSCULOSKELETAL: acceptable muscle strength, tone and stability bilateral.  Intrinsic muscluature intact bilateral.  Rectus appearance of foot and digits noted bilateral.   DERMATOLOGIC: skin color, texture, and turgor are within normal limits.  No preulcerative lesions or ulcers  are seen, no interdigital maceration noted.  No open lesions present. . No drainage noted.  NAILS  Thick disfigured discolored nail that is unattached to nail bed left hallux. Thick callused skin noted in lateral border left hallux nail.  No redness or swelling noted.         Assessment & Plan:  Onychomycosis Left hallux   IE  Debridement of mycotic nail  RTC prn  Gardiner Barefoot DPM

## 2015-08-30 ENCOUNTER — Emergency Department (HOSPITAL_COMMUNITY): Payer: Medicare Other

## 2015-08-30 ENCOUNTER — Observation Stay (HOSPITAL_COMMUNITY): Payer: Medicare Other

## 2015-08-30 ENCOUNTER — Observation Stay (HOSPITAL_COMMUNITY)
Admission: EM | Admit: 2015-08-30 | Discharge: 2015-08-31 | Disposition: A | Discharge status: Home / Self Care | Payer: Medicare Other | Attending: Internal Medicine | Admitting: Internal Medicine

## 2015-08-30 ENCOUNTER — Encounter (HOSPITAL_COMMUNITY): Payer: Self-pay | Admitting: Radiology

## 2015-08-30 DIAGNOSIS — R200 Anesthesia of skin: Secondary | ICD-10-CM | POA: Diagnosis not present

## 2015-08-30 DIAGNOSIS — M79605 Pain in left leg: Secondary | ICD-10-CM | POA: Insufficient documentation

## 2015-08-30 DIAGNOSIS — E038 Other specified hypothyroidism: Secondary | ICD-10-CM | POA: Diagnosis not present

## 2015-08-30 DIAGNOSIS — E43 Unspecified severe protein-calorie malnutrition: Secondary | ICD-10-CM | POA: Diagnosis not present

## 2015-08-30 DIAGNOSIS — G459 Transient cerebral ischemic attack, unspecified: Secondary | ICD-10-CM | POA: Diagnosis not present

## 2015-08-30 DIAGNOSIS — E039 Hypothyroidism, unspecified: Secondary | ICD-10-CM | POA: Diagnosis present

## 2015-08-30 DIAGNOSIS — Z96643 Presence of artificial hip joint, bilateral: Secondary | ICD-10-CM | POA: Insufficient documentation

## 2015-08-30 DIAGNOSIS — C9110 Chronic lymphocytic leukemia of B-cell type not having achieved remission: Secondary | ICD-10-CM | POA: Diagnosis present

## 2015-08-30 DIAGNOSIS — I341 Nonrheumatic mitral (valve) prolapse: Secondary | ICD-10-CM | POA: Diagnosis not present

## 2015-08-30 DIAGNOSIS — R269 Unspecified abnormalities of gait and mobility: Secondary | ICD-10-CM | POA: Diagnosis not present

## 2015-08-30 DIAGNOSIS — Z8673 Personal history of transient ischemic attack (TIA), and cerebral infarction without residual deficits: Secondary | ICD-10-CM | POA: Diagnosis not present

## 2015-08-30 DIAGNOSIS — I10 Essential (primary) hypertension: Secondary | ICD-10-CM | POA: Insufficient documentation

## 2015-08-30 DIAGNOSIS — Z7982 Long term (current) use of aspirin: Secondary | ICD-10-CM | POA: Diagnosis not present

## 2015-08-30 DIAGNOSIS — G458 Other transient cerebral ischemic attacks and related syndromes: Secondary | ICD-10-CM | POA: Diagnosis not present

## 2015-08-30 DIAGNOSIS — M6289 Other specified disorders of muscle: Secondary | ICD-10-CM | POA: Diagnosis not present

## 2015-08-30 DIAGNOSIS — R2990 Unspecified symptoms and signs involving the nervous system: Secondary | ICD-10-CM | POA: Diagnosis not present

## 2015-08-30 DIAGNOSIS — Z880 Allergy status to penicillin: Secondary | ICD-10-CM | POA: Diagnosis not present

## 2015-08-30 DIAGNOSIS — M25552 Pain in left hip: Secondary | ICD-10-CM | POA: Diagnosis present

## 2015-08-30 DIAGNOSIS — R531 Weakness: Secondary | ICD-10-CM | POA: Diagnosis present

## 2015-08-30 DIAGNOSIS — I351 Nonrheumatic aortic (valve) insufficiency: Secondary | ICD-10-CM | POA: Insufficient documentation

## 2015-08-30 DIAGNOSIS — E785 Hyperlipidemia, unspecified: Secondary | ICD-10-CM | POA: Diagnosis present

## 2015-08-30 DIAGNOSIS — M79662 Pain in left lower leg: Secondary | ICD-10-CM

## 2015-08-30 DIAGNOSIS — Z7902 Long term (current) use of antithrombotics/antiplatelets: Secondary | ICD-10-CM | POA: Diagnosis not present

## 2015-08-30 HISTORY — DX: Cerebral infarction, unspecified: I63.9

## 2015-08-30 LAB — RAPID URINE DRUG SCREEN, HOSP PERFORMED
Amphetamines: NOT DETECTED
BARBITURATES: NOT DETECTED
Benzodiazepines: NOT DETECTED
Cocaine: NOT DETECTED
Opiates: NOT DETECTED
TETRAHYDROCANNABINOL: NOT DETECTED

## 2015-08-30 LAB — COMPREHENSIVE METABOLIC PANEL
ALK PHOS: 41 U/L (ref 38–126)
ALT: 27 U/L (ref 14–54)
AST: 31 U/L (ref 15–41)
Albumin: 4.2 g/dL (ref 3.5–5.0)
Anion gap: 8 (ref 5–15)
BILIRUBIN TOTAL: 0.3 mg/dL (ref 0.3–1.2)
BUN: 17 mg/dL (ref 6–20)
CO2: 26 mmol/L (ref 22–32)
CREATININE: 0.76 mg/dL (ref 0.44–1.00)
Calcium: 9.6 mg/dL (ref 8.9–10.3)
Chloride: 106 mmol/L (ref 101–111)
GFR calc Af Amer: >60 mL/min (ref >60)
Glucose, Bld: 101 mg/dL — ABNORMAL HIGH (ref 65–99)
Potassium: 4.7 mmol/L (ref 3.5–5.1)
Sodium: 140 mmol/L (ref 135–145)
TOTAL PROTEIN: 6.7 g/dL (ref 6.5–8.1)

## 2015-08-30 LAB — I-STAT CHEM 8, ED
BUN: 21 mg/dL — ABNORMAL HIGH (ref 6–20)
CREATININE: 0.7 mg/dL (ref 0.44–1.00)
Calcium, Ion: 1.23 mmol/L (ref 1.13–1.30)
Chloride: 102 mmol/L (ref 101–111)
GLUCOSE: 94 mg/dL (ref 65–99)
HCT: 42 % (ref 36.0–46.0)
HEMOGLOBIN: 14.3 g/dL (ref 12.0–15.0)
POTASSIUM: 4.6 mmol/L (ref 3.5–5.1)
Sodium: 141 mmol/L (ref 135–145)
TCO2: 25 mmol/L (ref 0–100)

## 2015-08-30 LAB — CBC
HEMATOCRIT: 41.9 % (ref 36.0–46.0)
HEMOGLOBIN: 13.3 g/dL (ref 12.0–15.0)
MCH: 30.3 pg (ref 26.0–34.0)
MCHC: 31.7 g/dL (ref 30.0–36.0)
MCV: 95.4 fL (ref 78.0–100.0)
Platelets: 164 10*3/uL (ref 150–400)
RBC: 4.39 MIL/uL (ref 3.87–5.11)
RDW: 13.3 % (ref 11.5–15.5)
WBC: 5.1 10*3/uL (ref 4.0–10.5)

## 2015-08-30 LAB — URINALYSIS, ROUTINE W REFLEX MICROSCOPIC
Bilirubin Urine: NEGATIVE
Glucose, UA: NEGATIVE mg/dL
HGB URINE DIPSTICK: NEGATIVE
KETONES UR: NEGATIVE mg/dL
LEUKOCYTES UA: NEGATIVE
Nitrite: NEGATIVE
PROTEIN: NEGATIVE mg/dL
Specific Gravity, Urine: 1.006 (ref 1.005–1.030)
pH: 7 (ref 5.0–8.0)

## 2015-08-30 LAB — DIFFERENTIAL
BASOS ABS: 0 10*3/uL (ref 0.0–0.1)
Basophils Relative: 0 %
Eosinophils Absolute: 0 10*3/uL (ref 0.0–0.7)
Eosinophils Relative: 1 %
LYMPHS ABS: 1 10*3/uL (ref 0.7–4.0)
LYMPHS PCT: 19 %
MONOS PCT: 9 %
Monocytes Absolute: 0.5 10*3/uL (ref 0.1–1.0)
NEUTROS ABS: 3.6 10*3/uL (ref 1.7–7.7)
Neutrophils Relative %: 71 %

## 2015-08-30 LAB — I-STAT TROPONIN, ED: TROPONIN I, POC: 0 ng/mL (ref 0.00–0.08)

## 2015-08-30 LAB — PROTIME-INR
INR: 1.06 (ref 0.00–1.49)
Prothrombin Time: 14 seconds (ref 11.6–15.2)

## 2015-08-30 LAB — ETHANOL: Alcohol, Ethyl (B): <5 mg/dL (ref <5)

## 2015-08-30 LAB — APTT: APTT: 32 s (ref 24–37)

## 2015-08-30 MED ORDER — SENNOSIDES-DOCUSATE SODIUM 8.6-50 MG PO TABS: 1.0000 | ORAL_TABLET | Freq: Every evening | ORAL | Status: DC | PRN

## 2015-08-30 MED ORDER — ACETAMINOPHEN 650 MG RE SUPP: 650.0000 mg | RECTAL | Status: DC | PRN

## 2015-08-30 MED ORDER — STROKE: EARLY STAGES OF RECOVERY BOOK
Freq: Once | Status: AC
  Administered 2015-08-30: 21:00:00

## 2015-08-30 MED ORDER — CLOPIDOGREL BISULFATE 75 MG PO TABS
75.0000 mg | ORAL_TABLET | Freq: Every day | ORAL | Status: DC
  Administered 2015-08-30 – 2015-08-31 (×2): 75 mg via ORAL
  Filled 2015-08-30 (×2): qty 1

## 2015-08-30 MED ORDER — ENOXAPARIN SODIUM 40 MG/0.4ML ~~LOC~~ SOLN
40.0000 mg | SUBCUTANEOUS | Status: DC
  Administered 2015-08-30: 40 mg via SUBCUTANEOUS
  Filled 2015-08-30: qty 0.4

## 2015-08-30 MED ORDER — IOHEXOL 350 MG/ML SOLN
50.0000 mL | Freq: Once | INTRAVENOUS | Status: AC | PRN
  Administered 2015-08-30: 50 mL via INTRAVENOUS

## 2015-08-30 MED ORDER — LEVOTHYROXINE SODIUM 88 MCG PO TABS
88.0000 ug | ORAL_TABLET | Freq: Every day | ORAL | Status: DC
  Administered 2015-08-31: 88 ug via ORAL
  Filled 2015-08-30: qty 1

## 2015-08-30 MED ORDER — ACETAMINOPHEN 325 MG PO TABS
650.0000 mg | ORAL_TABLET | ORAL | Status: DC | PRN
  Administered 2015-08-30 – 2015-08-31 (×2): 650 mg via ORAL
  Filled 2015-08-30 (×2): qty 2

## 2015-08-30 MED ORDER — LIDOCAINE 5 % EX PTCH
1.0000 | MEDICATED_PATCH | CUTANEOUS | Status: DC
  Administered 2015-08-30: 1 via TRANSDERMAL
  Filled 2015-08-30 (×2): qty 1

## 2015-08-30 MED ORDER — LORAZEPAM 0.5 MG PO TABS: 0.5000 mg | ORAL_TABLET | Freq: Two times a day (BID) | ORAL | Status: DC | PRN

## 2015-08-30 MED ORDER — ATORVASTATIN CALCIUM 10 MG PO TABS
20.0000 mg | ORAL_TABLET | Freq: Every day | ORAL | Status: DC
  Administered 2015-08-30: 20 mg via ORAL
  Filled 2015-08-30: qty 2

## 2015-08-30 NOTE — Consult Note (Signed)
Requesting Physician: Dr.  Laneta Simmers    Reason for consultation: Evaluate for acute stroke  HPI:                                                                                                                                         Kathy Franklin is an 82 y.o. female patient who presented with worsening of the left-sided weakness and numbness symptoms. She has prior stroke reportedly had left-sided symptoms. She feels that her symptoms are worse than before. She noticed this at 6:30 AM when she woke up.  Date last known well:  08/28/14 Time last known well:  9 PM tPA Given: No: outside time window.   Stroke Risk Factors - hypertension  Past Medical History: Past Medical History  Diagnosis Date  . CLL (chronic lymphocytic leukemia) ([place]) 07/20/2011  . Arthritis   . PVC's (premature ventricular contractions)   . Thyroid disease   . Depression     Past Surgical History  Procedure Laterality Date  . Abdominal hysterectomy    . Appendectomy    . Hernia repair      Family History: Family History  Problem Relation Age of Onset  . Asthma Neg Hx   . Emphysema Neg Hx     Social History:   reports that she has never smoked. She has never used smokeless tobacco. She reports that she does not drink alcohol or use illicit drugs.  Allergies:  Allergies  Allergen Reactions  . Penicillins Rash     Medications:                                                                                                                         Current facility-administered medications:  .  clopidogrel (PLAVIX) tablet 75 mg, 75 mg, Oral, Daily, Radie Berges Yahoo, MD .  lidocaine (LIDODERM) 5 % 1 patch, 1 patch, Transdermal, Q24H, Chad Tiznado Fuller Mandril, MD  Current outpatient prescriptions:  .  aspirin 325 MG tablet, Take 1 tablet (325 mg total) by mouth daily., Disp: 30 tablet, Rfl: 0 .  atorvastatin (LIPITOR) 20 MG tablet, Take 1 tablet (20 mg total) by mouth daily at 6 PM., Disp: 30  tablet, Rfl: 1 .  levothyroxine (SYNTHROID, LEVOTHROID) 88 MCG tablet, Take 88 mcg by mouth daily before breakfast. , Disp: , Rfl:  .  cholecalciferol (VITAMIN D) 1000 UNITS tablet,  Take 1,000 Units by mouth every morning., Disp: , Rfl:  .  Fish Oil-Krill Oil (KRILL OIL PLUS) CAPS, Take 1 capsule by mouth daily. , Disp: , Rfl:  .  GARLIC PO, Take 1 tablet by mouth daily. , Disp: , Rfl:    ROS:                                                                                                                                       History obtained from the patient  General ROS: negative for - chills, fatigue, fever, night sweats, weight gain or weight loss Psychological ROS: negative for - behavioral disorder, hallucinations, memory difficulties, mood swings or suicidal ideation Ophthalmic ROS: negative for - blurry vision, double vision, eye pain or loss of vision ENT ROS: negative for - epistaxis, nasal discharge, oral lesions, sore throat, tinnitus or vertigo Allergy and Immunology ROS: negative for - hives or itchy/watery eyes Hematological and Lymphatic ROS: negative for - bleeding problems, bruising or swollen lymph nodes Endocrine ROS: negative for - galactorrhea, hair pattern changes, polydipsia/polyuria or temperature intolerance Respiratory ROS: negative for - cough, hemoptysis, shortness of breath or wheezing Cardiovascular ROS: negative for - chest pain, dyspnea on exertion, edema or irregular heartbeat Gastrointestinal ROS: negative for - abdominal pain, diarrhea, hematemesis, nausea/vomiting or stool incontinence Genito-Urinary ROS: negative for - dysuria, hematuria, incontinence or urinary frequency/urgency Musculoskeletal ROS: negative for - joint swelling or muscular weakness Neurological ROS: as noted in HPI Dermatological ROS: negative for rash and skin lesion changes  Neurologic Examination:                                                                                                       Blood pressure 174/96, pulse 59, resp. rate 14, SpO2 96 %.  Evaluation of higher integrative functions including: Level of alertness: Alert,  Oriented to time, place and person Speech: fluent, no evidence of dysarthria or aphasia noted.  Test the following cranial nerves: 2-12 grossly intact Motor examination: Normal tone, bulk, full 5/5 motor strength in all 4 extremities Examination of sensation : Normal and symmetric sensation to pinprick in all 4 extremities and on face Examination of deep tendon reflexes: 2+, normal and symmetric in all extremities, normal plantars bilaterally Test coordination: Normal finger nose testing, with no evidence of limb appendicular ataxia or abnormal involuntary movements or tremors noted.  Gait: antalgic    Lab Results: Basic Metabolic Panel: No results for input(s): NA, K, CL, CO2, GLUCOSE, BUN,  CREATININE, CALCIUM, MG, PHOS in the last 168 hours.  Liver Function Tests: No results for input(s): AST, ALT, ALKPHOS, BILITOT, PROT, ALBUMIN in the last 168 hours. No results for input(s): LIPASE, AMYLASE in the last 168 hours. No results for input(s): AMMONIA in the last 168 hours.  CBC: No results for input(s): WBC, NEUTROABS, HGB, HCT, MCV, PLT in the last 168 hours.  Cardiac Enzymes: No results for input(s): CKTOTAL, CKMB, CKMBINDEX, TROPONINI in the last 168 hours.  Lipid Panel: No results for input(s): CHOL, TRIG, HDL, CHOLHDL, VLDL, LDLCALC in the last 168 hours.  CBG: No results for input(s): GLUCAP in the last 168 hours.  Microbiology: Results for orders placed or performed during the hospital encounter of 06/17/07  Culture, blood (routine x 2)     Status: None   Collection Time: 06/17/07 10:40 PM  Result Value Ref Range Status   Specimen Description BLOOD RIGHT ARM  Final   Special Requests   Final    BOTTLES DRAWN AEROBIC AND ANAEROBIC 10CC AEROBIC 4CC ANAEROBIC   Culture NO GROWTH 5 DAYS  Final   Report Status  06/24/2007 FINAL  Final  Culture, blood (routine x 2)     Status: None   Collection Time: 06/17/07 10:50 PM  Result Value Ref Range Status   Specimen Description BLOOD RIGHT HAND  Final   Special Requests BOTTLES DRAWN AEROBIC ONLY 10CC  Final   Culture NO GROWTH 5 DAYS  Final   Report Status 06/24/2007 FINAL  Final  Pathologist smear review     Status: None   Collection Time: 06/17/07 10:50 PM  Result Value Ref Range Status   Tech Review   Final    ABSOLUTE LYMPHOCYTOSIS, CONSISTENT WITH CLL.  SUGGEST IMMUNOPHENOTYPING IF A NEW PERSISTENT FINDING. Reviewed by Nevada Crane, M.D. 06/18/07.     Imaging: Ct Head Wo Contrast  08/30/2015  CLINICAL DATA:  Right arm weakness. EXAM: CT HEAD WITHOUT CONTRAST TECHNIQUE: Contiguous axial images were obtained from the base of the skull through the vertex without contrast. COMPARISON:  06/28/2015 FINDINGS: Stable diffuse cerebral brain atrophy, most pronounced in the frontal lobes. Minor periventricular chronic white matter microvascular ischemic changes bilaterally. No acute intracranial hemorrhage, mass lesion, definite infarction, midline shift, herniation, hydrocephalus. Cisterns are patent. Cerebellar atrophy as well. Remote basal ganglia lacunar infarcts bilaterally. Benign basal ganglia calcifications evident. Orbits are symmetric. Atherosclerosis of the intracranial vessels of the skullbase. Mastoids and sinuses remain clear. Skull intact. IMPRESSION: Stable brain atrophy pattern and chronic white matter microvascular changes. No interval change or acute process by CT. Electronically Signed   By: Jerilynn Mages.  Shick M.D.   On: 08/30/2015 10:28    Assessment and plan:   Kathy Franklin is an 82 y.o. female patient who presented presented with subjective symptoms of left-sided weakness and numbness which she noticed this morning. Given her age and*factors recommend further neurodiagnostic workup with a CT and a gram of the head and neck as well as brain MRI  study. At the time of finalizing this note the studies were completed, showed no acute pathology or acute stroke.  Patient has some baseline gait problems with left sacroiliac joint pain and antalgic gait. He'll benefit from physical therapy for gait and balance training. Continue the home medications, except for switching her aspirin to Plavix, and discontinue aspirin. Discussed with patient and her daughter, answered several of their questions.   No further neurological issues to address at this time. Will sign off. Please call  for any further questions  Advised to follow up with her outpatient neurologist.

## 2015-08-30 NOTE — ED Notes (Signed)
Patient transported to MRI 

## 2015-08-30 NOTE — ED Provider Notes (Signed)
CSN: [id number]     Arrival date & time 08/30/15  [id number] History   First MD Initiated Contact with Patient 08/30/15 [phone]     Chief Complaint  Patient presents with  . Stroke Symptoms     (Consider location/radiation/quality/duration/timing/severity/associated sxs/prior Treatment) Patient is a 82 y.o. female presenting with Acute Neurological Problem. The history is provided by the patient.  Cerebrovascular Accident This is a new problem. The current episode started 12 to 24 hours ago. The problem occurs constantly. The problem has been gradually improving. Associated symptoms include headaches (mild left sided). Pertinent negatives include no chest pain, no abdominal pain and no shortness of breath. Nothing aggravates the symptoms. Nothing relieves the symptoms. She has tried nothing for the symptoms.    Past Medical History  Diagnosis Date  . CLL (chronic lymphocytic leukemia) ([place]) 07/20/2011  . Arthritis   . PVC's (premature ventricular contractions)   . Thyroid disease   . Depression    Past Surgical History  Procedure Laterality Date  . Abdominal hysterectomy    . Appendectomy    . Hernia repair     Family History  Problem Relation Age of Onset  . Asthma Neg Hx   . Emphysema Neg Hx    Social History  Substance Use Topics  . Smoking status: Never Smoker   . Smokeless tobacco: Never Used  . Alcohol Use: No   OB History    No data available     Review of Systems  Respiratory: Negative for shortness of breath.   Cardiovascular: Negative for chest pain.  Gastrointestinal: Negative for abdominal pain.  Neurological: Positive for headaches (mild left sided).  All other systems reviewed and are negative.     Allergies  Penicillins  Home Medications   Prior to Admission medications   Medication Sig Start Date End Date Taking? Authorizing Provider  aspirin 325 MG tablet Take 1 tablet (325 mg total) by mouth daily. 06/29/15  Yes Orson Eva, MD  atorvastatin  (LIPITOR) 20 MG tablet Take 1 tablet (20 mg total) by mouth daily at 6 PM. 06/29/15  Yes Orson Eva, MD  cholecalciferol (VITAMIN D) 1000 UNITS tablet Take 1,000 Units by mouth every morning.   Yes Historical Provider, MD  Fish Oil-Krill Oil (KRILL OIL PLUS) CAPS Take 1 capsule by mouth daily.    Yes Historical Provider, MD  GARLIC PO Take 1 tablet by mouth daily.    Yes Historical Provider, MD  levothyroxine (SYNTHROID, LEVOTHROID) 88 MCG tablet Take 88 mcg by mouth daily before breakfast.    Yes Historical Provider, MD  LORazepam (ATIVAN) 0.5 MG tablet Take 1 tablet by mouth as needed. Anxiety 06/13/15  Yes Historical Provider, MD   BP 174/96 mmHg  Pulse 59  Resp 14  SpO2 96% Physical Exam  Constitutional: She is oriented to person, place, and time. She appears well-developed and well-nourished. No distress.  HENT:  Head: Normocephalic.  Eyes: Conjunctivae are normal.  Neck: Neck supple. No tracheal deviation present.  Cardiovascular: Normal rate and regular rhythm.   Pulmonary/Chest: Effort normal. No respiratory distress.  Abdominal: Soft. She exhibits no distension.  Neurological: She is alert and oriented to person, place, and time. She has normal strength. No cranial nerve deficit or sensory deficit. Coordination normal. GCS eye subscore is 4. GCS verbal subscore is 5. GCS motor subscore is 6.  Skin: Skin is warm and dry.  Psychiatric: She has a normal mood and affect.    ED Course  Procedures (including critical  care time) Labs Review Labs Reviewed  I-STAT CHEM 8, ED - Abnormal; Notable for the following:    BUN 21 (*)    All other components within normal limits  CBC  DIFFERENTIAL  ETHANOL  PROTIME-INR  APTT  COMPREHENSIVE METABOLIC PANEL  URINE RAPID DRUG SCREEN, HOSP PERFORMED  URINALYSIS, ROUTINE W REFLEX MICROSCOPIC (NOT AT [facility])  I-STAT TROPOININ, ED    Imaging Review Ct Head Wo Contrast  08/30/2015  CLINICAL DATA:  Right arm weakness. EXAM: CT HEAD WITHOUT  CONTRAST TECHNIQUE: Contiguous axial images were obtained from the base of the skull through the vertex without contrast. COMPARISON:  06/28/2015 FINDINGS: Stable diffuse cerebral brain atrophy, most pronounced in the frontal lobes. Minor periventricular chronic white matter microvascular ischemic changes bilaterally. No acute intracranial hemorrhage, mass lesion, definite infarction, midline shift, herniation, hydrocephalus. Cisterns are patent. Cerebellar atrophy as well. Remote basal ganglia lacunar infarcts bilaterally. Benign basal ganglia calcifications evident. Orbits are symmetric. Atherosclerosis of the intracranial vessels of the skullbase. Mastoids and sinuses remain clear. Skull intact. IMPRESSION: Stable brain atrophy pattern and chronic white matter microvascular changes. No interval change or acute process by CT. Electronically Signed   By: Jerilynn Mages.  Shick M.D.   On: 08/30/2015 10:28   I have personally reviewed and evaluated these images and lab results as part of my medical decision-making.   EKG Interpretation   Date/Time:  Sunday August 30 2015 10:06:15 EST Ventricular Rate:  63 PR Interval:  180 QRS Duration: 100 QT Interval:  434 QTC Calculation: 444 R Axis:   53 Text Interpretation:  Sinus rhythm Anteroseptal infarct, age indeterminate  No significant change since last tracing Confirmed by Stephine Langbehn MD, Wilbert Schouten  ([id number]) on 08/30/2015 10:12:27 AM      MDM   Final diagnoses:  Left facial numbness   82 y.o. female presents with left facial tingling and numbness with left upper extremity weakness that she noticed this morning worse than her typical weakness upon waking. No significant hematologic, infectious, or metabolic abnormalities to explain symptoms. Last known normal was last night at 9:30 PM before she went to bed. Stroke workup initiated and neurology consult for evaluation. Neurology is recommending admission to complete stroke workup. Hospitalist was consulted for admission  and will see the patient in the emergency department.     Leo Grosser, MD 08/31/15 1250

## 2015-08-30 NOTE — ED Notes (Addendum)
Patient transported to CT 

## 2015-08-30 NOTE — ED Notes (Signed)
Patient transported to CT 

## 2015-08-30 NOTE — ED Notes (Signed)
To ED via [facility] from home with left sided weakness "feels funny" -- hx of CVA in Nov-- pt is alert/oriented x 4, speech clear/ grips equal ,

## 2015-08-30 NOTE — H&P (Signed)
[facility] History and Physical  Kathy Franklin [id number] DOB: [birth year] DOA: 08/30/2015  Referring physician:  PCP: Precious Reel, MD   Chief Complaint:   HPI: Kathy Franklin is a 82 y.o. female  with history of CLL, HTN, dyslipidemia, hypothyroidism, and presumed acute ischemic stroke in November 2016, presenting with left facial tingling and numbness, and left leg weakness and numbness, combined with minimal left upper extremity weakness. She reports waking up with the symptoms, accompanied with severe neck pain since 4 in the morning.  She also has Left hip, femur and knee pain. The patient took baby ASA prior to calling EMS. She denied any shortness of breath or cough. Denies dysarthria or visual disturbance.  onfusion is reported. Denies any falls. She denies any recent long distance strips. He denies any changes in her medications. She is on aspirin and Lipitor at home. She is not on hormonal replacement.  Her most recent echocardiogram in November of 2016 was negative for thrombi.  Her most recent MRA of the brain and carotid Doppler in 06/2015 were negative as well. No recent infections At the ED Neurology was contacted, workup is in progress. She received one dose of Plavix. No tPA or Heparin received. CMET and CBC were normal. Drug screen negative. CT head was negative for acute intracranial findings. CT angio of the neck shows minimal Left carotid artery disease, with no vertebral disease. MRI brain is negative for acute abnormalities. EKG remarkable for old anteroseptal infarct without new changes. Tn negative  No CXR was performed to date.   Review of Systems  Constitutional: Negative for fever, chills, weight loss, malaise/fatigue and diaphoresis.  HENT: Negative for sore throat and tinnitus.   Eyes: Negative.  Negative for blurred vision, double vision and photophobia.  Respiratory: Negative for cough, hemoptysis, sputum production, shortness of breath and wheezing.     Cardiovascular: Negative for chest pain, palpitations, orthopnea, claudication, leg swelling and PND.  Gastrointestinal: Negative for heartburn, nausea, vomiting, abdominal pain, diarrhea, constipation, blood in stool and melena.  Genitourinary: Negative for dysuria, urgency, frequency, hematuria and flank pain.  Musculoskeletal: Negative for myalgias, back pain, joint pain, falls and neck pain.  Skin: Negative.   Neurological: Positive for tingling, sensory change, focal weakness and weakness. Negative for dizziness, tremors, speech change, seizures, loss of consciousness and headaches.  Endo/Heme/Allergies: Negative for environmental allergies and polydipsia. Does not bruise/bleed easily.  Psychiatric/Behavioral: Negative for depression, suicidal ideas, hallucinations, memory loss and substance abuse. The patient is not nervous/anxious and does not have insomnia.     Past Medical History  Diagnosis Date  . CLL (chronic lymphocytic leukemia) ([place]) 07/20/2011  . Arthritis   . PVC's (premature ventricular contractions)   . Thyroid disease   . Depression    Past Surgical History  Procedure Laterality Date  . Abdominal hysterectomy    . Appendectomy    . Hernia repair     Social History:  reports that she has never smoked. She has never used smokeless tobacco. She reports that she does not drink alcohol or use illicit drugs.  Allergies  Allergen Reactions  . Penicillins Rash    Family History  Problem Relation Age of Onset  . Asthma Neg Hx   . Emphysema Neg Hx      Prior to Admission medications   Medication Sig Start Date End Date Taking? Authorizing Provider  aspirin 325 MG tablet Take 1 tablet (325 mg total) by mouth daily. 06/29/15  Yes Orson Eva, MD  atorvastatin (LIPITOR) 20 MG tablet Take 1 tablet (20 mg total) by mouth daily at 6 PM. 06/29/15  Yes Orson Eva, MD  cholecalciferol (VITAMIN D) 1000 UNITS tablet Take 1,000 Units by mouth every morning.   Yes Historical  Provider, MD  Fish Oil-Krill Oil (KRILL OIL PLUS) CAPS Take 1 capsule by mouth daily.    Yes Historical Provider, MD  GARLIC PO Take 1 tablet by mouth daily.    Yes Historical Provider, MD  levothyroxine (SYNTHROID, LEVOTHROID) 88 MCG tablet Take 88 mcg by mouth daily before breakfast.    Yes Historical Provider, MD  LORazepam (ATIVAN) 0.5 MG tablet Take 1 tablet by mouth as needed. Anxiety 06/13/15  Yes Historical Provider, MD   Physical Exam: Filed Vitals:   08/30/15 1200 08/30/15 1215 08/30/15 1415 08/30/15 1500  BP: 136/71 145/79 168/88 142/77  Pulse: 55 56 67 59  Temp:      TempSrc:      Resp: 14 15 20 13   SpO2: 97% 97% 97% 98%    Wt Readings from Last 3 Encounters:  08/28/15 49.896 kg (110 lb)  06/27/15 51.6 kg (113 lb 12.1 oz)  05/28/15 50.032 kg (110 lb 4.8 oz)    Physical Exam  Constitutional: She is well-developed, well-nourished, and in no distress.  HENT:  Head: Normocephalic and atraumatic.  Mouth/Throat: No oropharyngeal exudate.  Eyes: Pupils are equal, round, and reactive to light. No scleral icterus.  Neck: No JVD present.  Cardiovascular: Normal rate, regular rhythm, normal heart sounds and intact distal pulses.  Exam reveals no gallop and no friction rub.   No murmur heard. Pulmonary/Chest: No stridor. No respiratory distress. She has no wheezes. She has no rales. She exhibits no tenderness.  Abdominal: She exhibits no distension and no mass. There is tenderness. There is no rebound and no guarding.  Musculoskeletal: She exhibits tenderness. She exhibits no edema.  Left hip tenderness  Lymphadenopathy:    She has no cervical adenopathy.  Neurological: She displays normal reflexes. No cranial nerve deficit. She exhibits normal muscle tone. Coordination normal.  Skin: No rash noted. No erythema. No pallor.            Labs on Admission:  Basic Metabolic Panel:  Recent Labs Lab 08/30/15 1035 08/30/15 1047  NA 140 141  K 4.7 4.6  CL 106 102  CO2 26   --   GLUCOSE 101* 94  BUN 17 21*  CREATININE 0.76 0.70  CALCIUM 9.6  --     Liver Function Tests:  Recent Labs Lab 08/30/15 1035  AST 31  ALT 27  ALKPHOS 41  BILITOT 0.3  PROT 6.7  ALBUMIN 4.2   No results for input(s): LIPASE, AMYLASE in the last 168 hours. No results for input(s): AMMONIA in the last 168 hours.  CBC:  Recent Labs Lab 08/30/15 1035 08/30/15 1047  WBC 5.1  --   NEUTROABS 3.6  --   HGB 13.3 14.3  HCT 41.9 42.0  MCV 95.4  --   PLT 164  --      CBG: No results for input(s): GLUCAP in the last 168 hours.  Radiological Exams on Admission: Ct Angio Head W/cm &/or Wo Cm  08/30/2015  CLINICAL DATA:  Recurrence of left-sided weakness. EXAM: CT ANGIOGRAPHY HEAD AND NECK TECHNIQUE: Multidetector CT imaging of the head and neck was performed using the standard protocol during bolus administration of intravenous contrast. Multiplanar CT image reconstructions and MIPs were obtained to evaluate the vascular anatomy. Carotid  stenosis measurements (when applicable) are obtained utilizing NASCET criteria, using the distal internal carotid diameter as the denominator. CONTRAST:  74mL OMNIPAQUE IOHEXOL 350 MG/ML SOLN COMPARISON:  Head CT earlier same day. Multiple studies November 2016. FINDINGS: CTA NECK Aortic arch: Atherosclerosis of the arch but no aneurysm or dissection. Branching pattern of the brachiocephalic vessels is normal without origin stenosis. Right carotid system: Common carotid artery widely patent to the bifurcation. Mild atherosclerotic change of the distal common carotid artery and carotid bifurcation but no stenosis. Cervical internal carotid artery widely patent. Left carotid system: Common carotid artery widely patent to the bifurcation region. Atherosclerosis of the distal common carotid artery and of the carotid bifurcation. Minimal diameter of the proximal ICA is 4 mm. Compared to a more distal cervical ICA diameter of 5 mm, this indicates a 20%  stenosis. Vertebral arteries:Both vertebral artery origins are widely patent. Left vertebral artery is dominant. Both vertebral arteries are widely patent through the cervical region. Skeleton: Ordinary cervical spondylosis Other neck: No significant soft tissue lesion. Lung apices are clear. CTA HEAD Anterior circulation: Both internal carotid arteries are widely patent through the siphon regions. There is atherosclerotic calcification but no flow-limiting stenosis. The anterior and middle cerebral arteries are patent without proximal stenosis, aneurysm or vascular malformation. Posterior circulation: Both vertebral arteries are patent through the foramen magnum to the basilar. The left vertebral artery is dominant. No basilar stenosis. Posterior circulation branch vessels are normal. Venous sinuses: Patent and normal Anatomic variants: None significant Delayed phase: No abnormal enhancement IMPRESSION: Atherosclerosis, but no significant stenotic disease in the neck or in the head. Mild atherosclerosis of both carotid bifurcations. No stenosis on the right. 20% stenosis on the left. Atherosclerosis of the carotid siphon without measurable stenosis. No intracranial stenosis or occlusion. Electronically Signed   By: Nelson Chimes M.D.   On: 08/30/2015 12:04   Ct Head Wo Contrast  08/30/2015  CLINICAL DATA:  Right arm weakness. EXAM: CT HEAD WITHOUT CONTRAST TECHNIQUE: Contiguous axial images were obtained from the base of the skull through the vertex without contrast. COMPARISON:  06/28/2015 FINDINGS: Stable diffuse cerebral brain atrophy, most pronounced in the frontal lobes. Minor periventricular chronic white matter microvascular ischemic changes bilaterally. No acute intracranial hemorrhage, mass lesion, definite infarction, midline shift, herniation, hydrocephalus. Cisterns are patent. Cerebellar atrophy as well. Remote basal ganglia lacunar infarcts bilaterally. Benign basal ganglia calcifications evident.  Orbits are symmetric. Atherosclerosis of the intracranial vessels of the skullbase. Mastoids and sinuses remain clear. Skull intact. IMPRESSION: Stable brain atrophy pattern and chronic white matter microvascular changes. No interval change or acute process by CT. Electronically Signed   By: Jerilynn Mages.  Shick M.D.   On: 08/30/2015 10:28   Ct Angio Neck W/cm &/or Wo/cm  08/30/2015  CLINICAL DATA:  Recurrence of left-sided weakness. EXAM: CT ANGIOGRAPHY HEAD AND NECK TECHNIQUE: Multidetector CT imaging of the head and neck was performed using the standard protocol during bolus administration of intravenous contrast. Multiplanar CT image reconstructions and MIPs were obtained to evaluate the vascular anatomy. Carotid stenosis measurements (when applicable) are obtained utilizing NASCET criteria, using the distal internal carotid diameter as the denominator. CONTRAST:  8mL OMNIPAQUE IOHEXOL 350 MG/ML SOLN COMPARISON:  Head CT earlier same day. Multiple studies November 2016. FINDINGS: CTA NECK Aortic arch: Atherosclerosis of the arch but no aneurysm or dissection. Branching pattern of the brachiocephalic vessels is normal without origin stenosis. Right carotid system: Common carotid artery widely patent to the bifurcation. Mild atherosclerotic change of the  distal common carotid artery and carotid bifurcation but no stenosis. Cervical internal carotid artery widely patent. Left carotid system: Common carotid artery widely patent to the bifurcation region. Atherosclerosis of the distal common carotid artery and of the carotid bifurcation. Minimal diameter of the proximal ICA is 4 mm. Compared to a more distal cervical ICA diameter of 5 mm, this indicates a 20% stenosis. Vertebral arteries:Both vertebral artery origins are widely patent. Left vertebral artery is dominant. Both vertebral arteries are widely patent through the cervical region. Skeleton: Ordinary cervical spondylosis Other neck: No significant soft tissue  lesion. Lung apices are clear. CTA HEAD Anterior circulation: Both internal carotid arteries are widely patent through the siphon regions. There is atherosclerotic calcification but no flow-limiting stenosis. The anterior and middle cerebral arteries are patent without proximal stenosis, aneurysm or vascular malformation. Posterior circulation: Both vertebral arteries are patent through the foramen magnum to the basilar. The left vertebral artery is dominant. No basilar stenosis. Posterior circulation branch vessels are normal. Venous sinuses: Patent and normal Anatomic variants: None significant Delayed phase: No abnormal enhancement IMPRESSION: Atherosclerosis, but no significant stenotic disease in the neck or in the head. Mild atherosclerosis of both carotid bifurcations. No stenosis on the right. 20% stenosis on the left. Atherosclerosis of the carotid siphon without measurable stenosis. No intracranial stenosis or occlusion. Electronically Signed   By: Nelson Chimes M.D.   On: 08/30/2015 12:04   Mr Brain Wo Contrast  08/30/2015  CLINICAL DATA:  New onset right-sided numbness of the face and lips today. Previous history described left-sided weakness. EXAM: MRI HEAD WITHOUT CONTRAST TECHNIQUE: Multiplanar, multiecho pulse sequences of the brain and surrounding structures were obtained without intravenous contrast. COMPARISON:  CT and CT angiography same day FINDINGS: Diffusion imaging does not show any acute or subacute infarction. The brainstem and cerebellum are normal. The cerebral hemispheres show mild age related volume loss with a few scattered foci of T2 and FLAIR signal in the white matter consistent with minimal small vessel disease, fairly typical for age. No cortical or large vessel territory infarction. No mass lesion, hemorrhage, hydrocephalus or extra-axial collection. No pituitary mass. No inflammatory sinus disease. No skull or skullbase lesion. Major vessels at the base of the brain show flow.  IMPRESSION: No acute finding. Mild brain atrophy and chronic small-vessel disease of the white matter, fairly typical for age. Electronically Signed   By: Nelson Chimes M.D.   On: 08/30/2015 13:27    EKG: Independently reviewed.  Date/Time: Sunday August 30 2015 10:06:15 EST Ventricular Rate: 63 PR Interval: 180 QRS Duration: 100 QT Interval: 434 QTC Calculation: 444 R Axis: 53 Text Interpretation: Sinus rhythm Anteroseptal infarct, age indeterminate  No significant change since last tracing Confirmed by KNOTT MD, DANIEL   Assessment/Plan Principal Problem:   CVA (cerebral infarction) Active Problems:   Right facial numbness   Left-sided weakness   Left hip pain   Strokelike symptoms/slurred speech history of presumed acute ischemic stroke in November 2016 with negative workup at the time Evaluated by neurology in the emergency department. CT of the head and MRI brain negative CT angio neck negative Admit to tele Continue Plavix lipid panel  hemoglobin A1c blood pressure control  bedside swallow eval and if passes heart healthy diet  Left sided numbness and weakness Likely due to #1 PT/OT evaluation Appreciate Neuro involvement   Left Hip, femoral and knee pain Wil rule out any fractures Check XR left hip, femur and knee Pain control as needed  Code Status: Full Code  DVT Prophylaxis: Lovenox  Family Communication: at bedside Disposition Plan: Pending Improvement. Admitted for observation in tele bed. Expected LOS 24-48 hrs    [facility] E,PA-C [facility] [email or web address] Password [email or web address]

## 2015-08-31 ENCOUNTER — Encounter (HOSPITAL_COMMUNITY): Payer: Self-pay | Admitting: *Deleted

## 2015-08-31 ENCOUNTER — Ambulatory Visit (HOSPITAL_BASED_OUTPATIENT_CLINIC_OR_DEPARTMENT_OTHER): Payer: Medicare Other

## 2015-08-31 DIAGNOSIS — E785 Hyperlipidemia, unspecified: Secondary | ICD-10-CM

## 2015-08-31 DIAGNOSIS — M25552 Pain in left hip: Secondary | ICD-10-CM

## 2015-08-31 DIAGNOSIS — E038 Other specified hypothyroidism: Secondary | ICD-10-CM

## 2015-08-31 DIAGNOSIS — M79662 Pain in left lower leg: Secondary | ICD-10-CM

## 2015-08-31 DIAGNOSIS — G458 Other transient cerebral ischemic attacks and related syndromes: Secondary | ICD-10-CM

## 2015-08-31 DIAGNOSIS — C9110 Chronic lymphocytic leukemia of B-cell type not having achieved remission: Secondary | ICD-10-CM | POA: Diagnosis not present

## 2015-08-31 DIAGNOSIS — E43 Unspecified severe protein-calorie malnutrition: Secondary | ICD-10-CM

## 2015-08-31 DIAGNOSIS — M6289 Other specified disorders of muscle: Secondary | ICD-10-CM

## 2015-08-31 LAB — LIPID PANEL
Cholesterol: 171 mg/dL (ref 0–200)
HDL: 69 mg/dL (ref >40)
LDL CALC: 87 mg/dL (ref 0–99)
Total CHOL/HDL Ratio: 2.5 RATIO
Triglycerides: 74 mg/dL (ref <150)
VLDL: 15 mg/dL (ref 0–40)

## 2015-08-31 MED ORDER — ENSURE COMPLETE PO LIQD: 237.0000 mL | Freq: Two times a day (BID) | ORAL | Status: DC

## 2015-08-31 MED ORDER — CLOPIDOGREL BISULFATE 75 MG PO TABS: 75.0000 mg | ORAL_TABLET | Freq: Every day | ORAL | Status: AC

## 2015-08-31 NOTE — Progress Notes (Signed)
VASCULAR LAB PRELIMINARY  PRELIMINARY  PRELIMINARY  PRELIMINARY    Left lower extremity venous Doppler has been completed.  Left:  No evidence of DVT, superficial thrombosis, or Baker's  cyst.  completed.     Lalisa Kiehn, RVT, RDMS 08/31/2015, 3:05 PM

## 2015-08-31 NOTE — Progress Notes (Signed)
Occupational Therapy Evaluation Patient Details Name: Kathy Franklin MRN: [id number] DOB: [birth year] Today's Date: 08/31/2015    History of Present Illness 82 year old female with history of hypertension, hyperlipidemia, hypothyroidism, CLL, and an ischemic stroke in November 2016 which left her with some residual left sided weakness and numbness. Admitted to hospital due to c/o LLE pain.    Clinical Impression   Pt Independent with ADL and mobility at baseline. Pt continues to complain of pain in L hip/upper leg but states it feels better and she was afraid she was having another stroke. Pt functioning at baseline level and is safe to D/C home from OT standpoint when medically stable.     Follow Up Recommendations  No OT follow up;Supervision - Intermittent    Equipment Recommendations  None recommended by OT    Recommendations for Other Services       Precautions / Restrictions Precautions Precautions: None      Mobility Bed Mobility Overal bed mobility: Independent                Transfers Overall transfer level: Independent                    Balance Overall balance assessment: Independent                               Standardized Balance Assessment Standardized Balance Assessment : Dynamic Gait Index   Dynamic Gait Index Level Surface: Normal Change in Gait Speed: Normal Gait with Horizontal Head Turns: Normal Gait with Vertical Head Turns: Normal Gait and Pivot Turn: Normal Step Over Obstacle: Normal Step Around Obstacles: Normal Steps: Normal Total Score: 24      ADL Overall ADL's : Independent                                       General ADL Comments: at baseline     Vision     Perception     Praxis      Pertinent Vitals/Pain Pain Assessment: 0-10 Pain Score: 3  Pain Location: L hip/leg Pain Descriptors / Indicators: Aching;Heaviness Pain Intervention(s): Limited activity within patient's  tolerance     Hand Dominance Right   Extremity/Trunk Assessment Upper Extremity Assessment Upper Extremity Assessment: Overall WFL for tasks assessed   Lower Extremity Assessment Lower Extremity Assessment: Defer to PT evaluation   Cervical / Trunk Assessment Cervical / Trunk Assessment: Normal   Communication Communication Communication: No difficulties   Cognition Arousal/Alertness: Awake/alert Behavior During Therapy: WFL for tasks assessed/performed Overall Cognitive Status: Within Functional Limits for tasks assessed                     General Comments       Exercises       Shoulder Instructions      Home Living Family/patient expects to be discharged to:: Private residence Living Arrangements: Spouse/significant other Available Help at Discharge: Family;Available 24 hours/day Type of Home: House Home Access: Level entry     Home Layout: Multi-level;Able to live on main level with bedroom/bathroom Alternate Level Stairs-Number of Steps: 3   Bathroom Shower/Tub: Occupational psychologist: Handicapped height Bathroom Accessibility: Yes How Accessible: Accessible via walker Home Equipment: [place] - 2 wheels;Cane - single point;Shower seat - built in;Grab bars - toilet;Grab bars - tub/shower  Additional Comments: batghroom remodeled  - handicapped accessible      Prior Functioning/Environment Level of Independence: Independent             OT Diagnosis: Acute pain   OT Problem List: Pain   OT Treatment/Interventions:      OT Goals(Current goals can be found in the care plan section) Acute Rehab OT Goals Patient Stated Goal: to go home  OT Frequency:     Barriers to D/C:            Co-evaluation              End of Session Nurse Communication: Mobility status;Other (comment) (safe to ambulate @ room)  Activity Tolerance: Patient tolerated treatment well Patient left: in bed;with call bell/phone within reach;Other  (comment) (with PT)   Time: [id number] OT Time Calculation (min): 15 min Charges:  OT General Charges $OT Visit: 1 Procedure OT Evaluation $OT Eval Low Complexity: 1 Procedure G-Codes: OT G-codes **NOT FOR INPATIENT CLASS** Functional Assessment Tool Used: clinical judgement Functional Limitation: Self care Self Care Current Status [id number]): 0 percent impaired, limited or restricted Self Care Goal Status [id number]): 0 percent impaired, limited or restricted Self Care Discharge Status [id number]): 0 percent impaired, limited or restricted  Malvika Tung,HILLARY 08/31/2015, 9:20 AM   [facility], OTR/L  [phone] 08/31/2015

## 2015-08-31 NOTE — Progress Notes (Signed)
Pt for discharge home today. Discharge orders received. 2 IVs and telemetry dcd with dressings clean dry and intact. Discharge instructions given with verbalized understanding. Stroke prevention education and handouts given. Daughter at bedside to assist with discharge. Staff brought patient to lobby via wheelchair at 1645. Transported to home by family member.

## 2015-08-31 NOTE — Discharge Summary (Signed)
Physician Discharge Summary  Kathy Franklin [id number] DOB: 1926-12-15 DOA: 08/30/2015  PCP: Precious Reel, MD  Admit date: 08/30/2015 Discharge date: 08/31/2015  Recommendations for Outpatient Follow-up:  1. Follow up with primary care doctor in 1-2 weeks, particularly if left leg pain is not improving 2. Routine follow up with Neurology in 2 weeks or sooner as needed  Discharge Diagnoses:  Principal Problem:   TIA (transient ischemic attack) Active Problems:   CLL (chronic lymphocytic leukemia) (HCC)   Protein-calorie malnutrition, severe (HCC)   Hypothyroidism   Dyslipidemia   Right facial numbness   Left-sided weakness   Left hip pain   Discharge Condition: stable, improved  Diet recommendation:  Regular with supplements  Wt Readings from Last 3 Encounters:  08/28/15 49.896 kg (110 lb)  06/27/15 51.6 kg (113 lb 12.1 oz)  05/28/15 50.032 kg (110 lb 4.8 oz)    History of present illness:   The patient is an 82 year old female with history of hypertension, hyperlipidemia, hypothyroidism, CLL, and an ischemic stroke in November 2016 which left her with some residual left sided weakness and numbness. Although she has had some overall gradual improvement in the symptoms, she stated that on a day-to-day basis she notices some changes to the sensation and strength of her left face, arm, and leg. She presented from home after she developed acute onset of left lateral thigh pain and possibly some mild worsening of her previous stroke symptoms. She took aspirin and told her family she was concerned she may be having a stroke. She initially denied dysarthria but stated that her left face felt a little bit more numb and weak than usual. She denied confusion. Although she told her family and ER staff and my colleague that the primary reason that she came to the hospital because was because she was worried about stroke, when I interviewed her she stated that the reason why she came to the  hospital was because her left leg was hurting so badly that it made her weak on the left side. This history surprised her family in the room also. She stated that she had been to her primary care doctor for similar pain a week or 2 ago and had some x-rays done that showed that the hardware from her previous hip replacement surgery was stable. She continued to have intermittent sharp pain worse with walking or lifting her leg at the hip but not bending her knee. The pain this morning was more severe than prior. Her vital signs were notable for some sinus tachycardia initially and mild hypertension, cover heart rate and her blood pressure both improved gradually. Her labs were unremarkable. Her head CT was negative. She underwent a CT angiogram head and neck which demonstrated no significant stenosis of the carotids or intracranial vessels. MRI of the brain was negative for acute change. She had no evidence of underlying infection.  Hospital Course:   82 yo F with previous stroke, HTN, HLD, Hypothyroidism, CLL who presents with possible worsening of her left sided stroke symptoms and pain in the left thigh.   TIA:  She was seen by Neurology.  Her MRI demonstrated no evidence of stroke and her CT angiogram of the head and neck demonstrated no evidence of flow-limited stenosis.  She had an ECHO about two months prior to admission which demonstrated preserved EF with grade 1 DD and no wall motion abnormalities.  She had moderate aortic valve regurgitation and mitral valve prolapse but no ASD or PFO.  Neurology  recommended stopping her aspirin and starting plavix which has a slight benefit in risk reduction for stroke.  She was seen by physical and occupational therapy who recommended no follow up at this time.  Lipid panel complete with LDL 87 and A1c 5.7.  I did NOT increase her statin because she is small and frail with severe malnutrition.     Left leg pain. XR of the left hip, femur, and knee were  normal. Duplex left lower extremity was negative.  She may have strained a muscle due to compensation for her stroke, or this may represent a mononeuropathy.  Her history is somewhat difficult, but she seems to have pain with palpation of the actual left lateral thigh and also the calf suggesting a muscular problem or strain. Defer to PCP, but if worsening, consider CT scan to eval for microfracture.  Hypertension with initially elevated blood pressures, likely due to pain.  Her blood pressures trended down spontaneously and with pain control.  Hypothyroidism, TSH, continue synthroid  CLL, her blood counts remained stable  Severe protein calorie malnutrition, recommended regular diet and supplements  Procedures:  CT head  CT angio head and neck  MRI brain  XR pelvis and femur  CXR  Consultations:  Neurology  Discharge Exam: Filed Vitals:   08/31/15 0400 08/31/15 0951  BP: 138/64 141/68  Pulse: 54 75  Temp: 98 F (36.7 C) 97.5 F (36.4 C)  Resp:  18   Filed Vitals:   08/30/15 2245 08/31/15 0100 08/31/15 0400 08/31/15 0951  BP: 104/64 120/73 138/64 141/68  Pulse: 61 51 54 75  Temp: 98.3 F (36.8 C) 98.9 F (37.2 C) 98 F (36.7 C) 97.5 F (36.4 C)  TempSrc: Oral Oral Oral Oral  Resp: 18 17  18   SpO2: 95% 96% 97% 100%    On exam: Thin female, no acute distress CV: Regular rate and rhythm, no murmurs, rubs, or gallops Pulmonary: Clear to auscultation bilaterally Abdomen: NABS, soft, nondistended, nontender MSK: No lower extremity edema, normal tone and bulk. Pain with palpation along the IT band on the left side.  Neuro: Mild left facial droop, no dysarthria, decreased sensation on the left face, left arm, left leg.   Discharge Instructions      Discharge Instructions    Call MD for:  difficulty breathing, headache or visual disturbances    Complete by:  As directed      Call MD for:  extreme fatigue    Complete by:  As directed      Call MD for:   hives    Complete by:  As directed      Call MD for:  persistant dizziness or light-headedness    Complete by:  As directed      Call MD for:  persistant nausea and vomiting    Complete by:  As directed      Call MD for:  severe uncontrolled pain    Complete by:  As directed      Call MD for:  temperature >100.4    Complete by:  As directed      Diet general    Complete by:  As directed      Discharge instructions    Complete by:  As directed   Please stop your daily aspirin and take plavix instead.     Increase activity slowly    Complete by:  As directed             Medication List  TAKE these medications        atorvastatin 20 MG tablet  Commonly known as:  LIPITOR  Take 1 tablet (20 mg total) by mouth daily at 6 PM.     cholecalciferol 1000 units tablet  Commonly known as:  VITAMIN D  Take 1,000 Units by mouth every morning.     clopidogrel 75 MG tablet  Commonly known as:  PLAVIX  Take 1 tablet (75 mg total) by mouth daily.     feeding supplement (ENSURE COMPLETE) Liqd  Take 237 mLs by mouth 2 (two) times daily between meals.     GARLIC PO  Take 1 tablet by mouth daily.     KRILL OIL PLUS Caps  Take 1 capsule by mouth daily.     levothyroxine 88 MCG tablet  Commonly known as:  SYNTHROID, LEVOTHROID  Take 88 mcg by mouth daily before breakfast.     LORazepam 0.5 MG tablet  Commonly known as:  ATIVAN  Take 1 tablet by mouth as needed. Anxiety       Follow-up Information    Follow up with Precious Reel, MD. Schedule an appointment as soon as possible for a visit in 1 month.   Specialty:  Internal Medicine   Contact information:   [address] [place] [address] [phone]       Follow up with Lenor Coffin, MD. Schedule an appointment as soon as possible for a visit in 2 weeks.   Specialty:  Neurology   Contact information:   [address] [place] [address] [phone]        The results of significant  diagnostics from this hospitalization (including imaging, microbiology, ancillary and laboratory) are listed below for reference.    Significant Diagnostic Studies: Ct Angio Head W/cm &/or Wo Cm  08/30/2015  CLINICAL DATA:  Recurrence of left-sided weakness. EXAM: CT ANGIOGRAPHY HEAD AND NECK TECHNIQUE: Multidetector CT imaging of the head and neck was performed using the standard protocol during bolus administration of intravenous contrast. Multiplanar CT image reconstructions and MIPs were obtained to evaluate the vascular anatomy. Carotid stenosis measurements (when applicable) are obtained utilizing NASCET criteria, using the distal internal carotid diameter as the denominator. CONTRAST:  32mL OMNIPAQUE IOHEXOL 350 MG/ML SOLN COMPARISON:  Head CT earlier same day. Multiple studies November 2016. FINDINGS: CTA NECK Aortic arch: Atherosclerosis of the arch but no aneurysm or dissection. Branching pattern of the brachiocephalic vessels is normal without origin stenosis. Right carotid system: Common carotid artery widely patent to the bifurcation. Mild atherosclerotic change of the distal common carotid artery and carotid bifurcation but no stenosis. Cervical internal carotid artery widely patent. Left carotid system: Common carotid artery widely patent to the bifurcation region. Atherosclerosis of the distal common carotid artery and of the carotid bifurcation. Minimal diameter of the proximal ICA is 4 mm. Compared to a more distal cervical ICA diameter of 5 mm, this indicates a 20% stenosis. Vertebral arteries:Both vertebral artery origins are widely patent. Left vertebral artery is dominant. Both vertebral arteries are widely patent through the cervical region. Skeleton: Ordinary cervical spondylosis Other neck: No significant soft tissue lesion. Lung apices are clear. CTA HEAD Anterior circulation: Both internal carotid arteries are widely patent through the siphon regions. There is atherosclerotic  calcification but no flow-limiting stenosis. The anterior and middle cerebral arteries are patent without proximal stenosis, aneurysm or vascular malformation. Posterior circulation: Both vertebral arteries are patent through the foramen magnum to the basilar. The left vertebral artery is  dominant. No basilar stenosis. Posterior circulation branch vessels are normal. Venous sinuses: Patent and normal Anatomic variants: None significant Delayed phase: No abnormal enhancement IMPRESSION: Atherosclerosis, but no significant stenotic disease in the neck or in the head. Mild atherosclerosis of both carotid bifurcations. No stenosis on the right. 20% stenosis on the left. Atherosclerosis of the carotid siphon without measurable stenosis. No intracranial stenosis or occlusion. Electronically Signed   By: Nelson Chimes M.D.   On: 08/30/2015 12:04   Dg Chest 2 View  08/30/2015  CLINICAL DATA:  82 year old female with acute left leg numbness. EXAM: CHEST  2 VIEW COMPARISON:  07/22/2014 and prior exams FINDINGS: The cardiomediastinal silhouette is unremarkable. A right sided Port-A-Cath is again identified with tip overlying the lower SVC. There is no evidence of focal airspace disease, pulmonary edema, suspicious pulmonary nodule/mass, pleural effusion, or pneumothorax. No acute bony abnormalities are identified. IMPRESSION: No active cardiopulmonary disease. Electronically Signed   By: Margarette Canada M.D.   On: 08/30/2015 19:22   Dg Pelvis 1-2 Views  08/30/2015  CLINICAL DATA:  Left hip pain starting today.  Recent stroke. EXAM: PELVIS - 1-2 VIEW COMPARISON:  05/07/2009 FINDINGS: Bony demineralization. Left total hip prosthesis. Clips project over the right groin Lower lumbar spondylosis with loss of disc height at L5-S1. No well-defined fracture identified. Excreted contrast medium is present in the urinary bladder. IMPRESSION: 1. No pelvic fracture or acute bony findings. The patient has a left hip implant in place which  is partially visualized on today's pelvic radiograph. 2. Lower lumbar spondylosis and degenerative disc disease. 3. Bony demineralization.  This can reduced diagnostic sensitivity. Electronically Signed   By: Van Clines M.D.   On: 08/30/2015 17:09   Ct Head Wo Contrast  08/30/2015  CLINICAL DATA:  Right arm weakness. EXAM: CT HEAD WITHOUT CONTRAST TECHNIQUE: Contiguous axial images were obtained from the base of the skull through the vertex without contrast. COMPARISON:  06/28/2015 FINDINGS: Stable diffuse cerebral brain atrophy, most pronounced in the frontal lobes. Minor periventricular chronic white matter microvascular ischemic changes bilaterally. No acute intracranial hemorrhage, mass lesion, definite infarction, midline shift, herniation, hydrocephalus. Cisterns are patent. Cerebellar atrophy as well. Remote basal ganglia lacunar infarcts bilaterally. Benign basal ganglia calcifications evident. Orbits are symmetric. Atherosclerosis of the intracranial vessels of the skullbase. Mastoids and sinuses remain clear. Skull intact. IMPRESSION: Stable brain atrophy pattern and chronic white matter microvascular changes. No interval change or acute process by CT. Electronically Signed   By: Jerilynn Mages.  Shick M.D.   On: 08/30/2015 10:28   Ct Angio Neck W/cm &/or Wo/cm  08/30/2015  CLINICAL DATA:  Recurrence of left-sided weakness. EXAM: CT ANGIOGRAPHY HEAD AND NECK TECHNIQUE: Multidetector CT imaging of the head and neck was performed using the standard protocol during bolus administration of intravenous contrast. Multiplanar CT image reconstructions and MIPs were obtained to evaluate the vascular anatomy. Carotid stenosis measurements (when applicable) are obtained utilizing NASCET criteria, using the distal internal carotid diameter as the denominator. CONTRAST:  67mL OMNIPAQUE IOHEXOL 350 MG/ML SOLN COMPARISON:  Head CT earlier same day. Multiple studies November 2016. FINDINGS: CTA NECK Aortic arch:  Atherosclerosis of the arch but no aneurysm or dissection. Branching pattern of the brachiocephalic vessels is normal without origin stenosis. Right carotid system: Common carotid artery widely patent to the bifurcation. Mild atherosclerotic change of the distal common carotid artery and carotid bifurcation but no stenosis. Cervical internal carotid artery widely patent. Left carotid system: Common carotid artery widely patent to the  bifurcation region. Atherosclerosis of the distal common carotid artery and of the carotid bifurcation. Minimal diameter of the proximal ICA is 4 mm. Compared to a more distal cervical ICA diameter of 5 mm, this indicates a 20% stenosis. Vertebral arteries:Both vertebral artery origins are widely patent. Left vertebral artery is dominant. Both vertebral arteries are widely patent through the cervical region. Skeleton: Ordinary cervical spondylosis Other neck: No significant soft tissue lesion. Lung apices are clear. CTA HEAD Anterior circulation: Both internal carotid arteries are widely patent through the siphon regions. There is atherosclerotic calcification but no flow-limiting stenosis. The anterior and middle cerebral arteries are patent without proximal stenosis, aneurysm or vascular malformation. Posterior circulation: Both vertebral arteries are patent through the foramen magnum to the basilar. The left vertebral artery is dominant. No basilar stenosis. Posterior circulation branch vessels are normal. Venous sinuses: Patent and normal Anatomic variants: None significant Delayed phase: No abnormal enhancement IMPRESSION: Atherosclerosis, but no significant stenotic disease in the neck or in the head. Mild atherosclerosis of both carotid bifurcations. No stenosis on the right. 20% stenosis on the left. Atherosclerosis of the carotid siphon without measurable stenosis. No intracranial stenosis or occlusion. Electronically Signed   By: Nelson Chimes M.D.   On: 08/30/2015 12:04   Mr  Brain Wo Contrast  08/30/2015  CLINICAL DATA:  New onset right-sided numbness of the face and lips today. Previous history described left-sided weakness. EXAM: MRI HEAD WITHOUT CONTRAST TECHNIQUE: Multiplanar, multiecho pulse sequences of the brain and surrounding structures were obtained without intravenous contrast. COMPARISON:  CT and CT angiography same day FINDINGS: Diffusion imaging does not show any acute or subacute infarction. The brainstem and cerebellum are normal. The cerebral hemispheres show mild age related volume loss with a few scattered foci of T2 and FLAIR signal in the white matter consistent with minimal small vessel disease, fairly typical for age. No cortical or large vessel territory infarction. No mass lesion, hemorrhage, hydrocephalus or extra-axial collection. No pituitary mass. No inflammatory sinus disease. No skull or skullbase lesion. Major vessels at the base of the brain show flow. IMPRESSION: No acute finding. Mild brain atrophy and chronic small-vessel disease of the white matter, fairly typical for age. Electronically Signed   By: Nelson Chimes M.D.   On: 08/30/2015 13:27   Dg Femur Min 2 Views Left  08/30/2015  CLINICAL DATA:  Left-sided hip and femur pain beginning today. No known injury. EXAM: LEFT FEMUR 2 VIEWS COMPARISON:  None. FINDINGS: Bipolar right hip prosthesis is seen in appropriate position. No evidence fracture or dislocation. No other bone abnormality seen involving the femur. Pubic symphysis and lower lumbar spine degenerative changes noted. IMPRESSION: No acute findings. Electronically Signed   By: Earle Gell M.D.   On: 08/30/2015 17:06    Microbiology: No results found for this or any previous visit (from the past 240 hour(s)).   Labs: Basic Metabolic Panel:  Recent Labs Lab 08/30/15 1035 08/30/15 1047  NA 140 141  K 4.7 4.6  CL 106 102  CO2 26  --   GLUCOSE 101* 94  BUN 17 21*  CREATININE 0.76 0.70  CALCIUM 9.6  --    Liver Function  Tests:  Recent Labs Lab 08/30/15 1035  AST 31  ALT 27  ALKPHOS 41  BILITOT 0.3  PROT 6.7  ALBUMIN 4.2   No results for input(s): LIPASE, AMYLASE in the last 168 hours. No results for input(s): AMMONIA in the last 168 hours. CBC:  Recent Labs Lab  08/30/15 1035 08/30/15 1047  WBC 5.1  --   NEUTROABS 3.6  --   HGB 13.3 14.3  HCT 41.9 42.0  MCV 95.4  --   PLT 164  --    Cardiac Enzymes: No results for input(s): CKTOTAL, CKMB, CKMBINDEX, TROPONINI in the last 168 hours. BNP: BNP (last 3 results) No results for input(s): BNP in the last 8760 hours.  ProBNP (last 3 results) No results for input(s): PROBNP in the last 8760 hours.  CBG: No results for input(s): GLUCAP in the last 168 hours.  Time coordinating discharge: 35 minutes  Signed:  Khizar Fiorella  [facility] 08/31/2015, 2:07 PM

## 2015-08-31 NOTE — Evaluation (Signed)
Physical Therapy Evaluation Patient Details Name: Kathy Franklin MRN: [id number] DOB: July 02, 1927 Today's Date: 08/31/2015   History of Present Illness  82 year old female with history of hypertension, hyperlipidemia, hypothyroidism, CLL, and an ischemic stroke in November 2016 which left her with some residual left sided weakness and numbness. Admitted to hospital due to c/o LLE pain. Pt reports pain is primarily over lateral lt hip and thigh and sometimes down to her knee    Clinical Impression  Patient evaluated by Physical Therapy with no further acute PT needs identified. Educated that her left hip/thigh pain may be due to narrowing of L4-5 space (as seen on recent xray and previous MRI 2013). Educated on possible benefit of OPPT if pain worsens or persists > 6 weeks. All education has been completed and the patient has no further questions. PT is signing off. Thank you for this referral.     Follow Up Recommendations No PT follow up (may ultimately need OPPT for lt hip/thigh pain--?L4-5 narrowing is cause)    Equipment Recommendations  None recommended by PT    Recommendations for Other Services       Precautions / Restrictions Precautions Precautions: None      Mobility  Bed Mobility Overal bed mobility: Independent                Transfers Overall transfer level: Independent                  Ambulation/Gait Ambulation/Gait assistance: Independent Ambulation Distance (Feet): 15 Feet (in room) Assistive device: None Gait Pattern/deviations: WFL(Within Functional Limits)     General Gait Details: able to make turns, step backwards, sideways  Stairs            Wheelchair Mobility    Modified Rankin (Stroke Patients Only)       Balance Overall balance assessment: Independent                               Standardized Balance Assessment Standardized Balance Assessment : Dynamic Gait Index (done with OT just prior)          Pertinent Vitals/Pain Pain Assessment: 0-10 Pain Score: 3  Pain Location: Lt hip/thigh Pain Descriptors / Indicators: Aching;Heaviness Pain Intervention(s): Limited activity within patient's tolerance;Monitored during session;Other (comment) (Educated re: ? back as source of pain and possible intervent)    Home Living Family/patient expects to be discharged to:: Private residence Living Arrangements: Spouse/significant other Available Help at Discharge: Family;Available 24 hours/day Type of Home: House Home Access: Level entry     Home Layout: Multi-level;Able to live on main level with bedroom/bathroom Home Equipment: Gilford Rile - 2 wheels;Cane - single point;Shower seat - built in;Grab bars - toilet;Grab bars - tub/shower Additional Comments: bathroom remodeled  - handicapped accessible    Prior Function Level of Independence: Independent               Hand Dominance   Dominant Hand: Right    Extremity/Trunk Assessment   Upper Extremity Assessment: Overall WFL for tasks assessed           Lower Extremity Assessment: Overall WFL for tasks assessed      Cervical / Trunk Assessment: Normal  Communication   Communication: No difficulties  Cognition Arousal/Alertness: Awake/alert Behavior During Therapy: WFL for tasks assessed/performed Overall Cognitive Status: Within Functional Limits for tasks assessed  General Comments General comments (skin integrity, edema, etc.): Reviewed Xrays of pelvis and MRI of lumbar spine from 2013; pt noted to have loss of disc height L4-5 and foraminal narrowing Lt L4-5. Discussed possible back as sourece of her pain. Discussed drinking plenty of fluids for hydration (disc height). If pain worsens to point of antalgic gait, recommend OPPT referral with ?benefit of traction. Educated on proper use of cane vs RW if antalgic gait develops.     Exercises        Assessment/Plan    PT Assessment All  further PT needs can be met in the next venue of care  PT Diagnosis Acute pain   PT Problem List Impaired sensation;Pain  PT Treatment Interventions     PT Goals (Current goals can be found in the Care Plan section) Acute Rehab PT Goals Patient Stated Goal: to go home PT Goal Formulation: All assessment and education complete, DC therapy    Frequency     Barriers to discharge        Co-evaluation               End of Session   Activity Tolerance: Patient tolerated treatment well Patient left: Other (comment) (at sink performing ADLs independently) Nurse Communication: Mobility status (no PT needs)    Functional Assessment Tool Used: clinical judgement Functional Limitation: Mobility: Walking and moving around Mobility: Walking and Moving Around Current Status (L3903): At least 1 percent but less than 20 percent impaired, limited or restricted Mobility: Walking and Moving Around Goal Status [phone]): At least 1 percent but less than 20 percent impaired, limited or restricted Mobility: Walking and Moving Around Discharge Status [phone]): At least 1 percent but less than 20 percent impaired, limited or restricted    Time: 0910-0931 PT Time Calculation (min) (ACUTE ONLY): 21 min   Charges:   PT Evaluation $PT Eval Low Complexity: 1 Procedure     PT G Codes:   PT G-Codes **NOT FOR INPATIENT CLASS** Functional Assessment Tool Used: clinical judgement Functional Limitation: Mobility: Walking and moving around Mobility: Walking and Moving Around Current Status (A2633): At least 1 percent but less than 20 percent impaired, limited or restricted Mobility: Walking and Moving Around Goal Status [phone]): At least 1 percent but less than 20 percent impaired, limited or restricted Mobility: Walking and Moving Around Discharge Status [phone]): At least 1 percent but less than 20 percent impaired, limited or restricted    Kyna Blahnik 08/31/2015, 9:43 AM  Pager [phone]

## 2015-09-01 LAB — HEMOGLOBIN A1C
Hgb A1c MFr Bld: 5.6 % (ref 4.8–5.6)
MEAN PLASMA GLUCOSE: 114 mg/dL

## 2015-09-17 ENCOUNTER — Encounter: Payer: Self-pay | Admitting: Neurology

## 2015-09-17 ENCOUNTER — Ambulatory Visit (INDEPENDENT_AMBULATORY_CARE_PROVIDER_SITE_OTHER): Payer: Medicare Other | Admitting: Neurology

## 2015-09-17 VITALS — BP 151/76 | HR 61 | Ht 66.0 in | Wt 108.2 lb

## 2015-09-17 DIAGNOSIS — I639 Cerebral infarction, unspecified: Secondary | ICD-10-CM

## 2015-09-17 DIAGNOSIS — I6381 Other cerebral infarction due to occlusion or stenosis of small artery: Secondary | ICD-10-CM

## 2015-09-17 NOTE — Patient Instructions (Signed)
I had a long d/w patient and daughter about her recent stroke, risk for recurrent stroke/TIAs, personally independently reviewed imaging studies and stroke evaluation results and answered questions.Continue Plavix for secondary stroke prevention and maintain strict control of hypertension with blood pressure goal below 130/90, diabetes with hemoglobin A1c goal below 6.5% and lipids with LDL cholesterol goal below 70 mg/dL. I also advised the patient to eat a healthy diet with plenty of whole grains, cereals, fruits and vegetables, exercise regularly and maintain ideal body weight .I advised the patient to see her primary physician Dr. Shon Baton monitor her blood pressure if it stays elevated she may need to be started on antihypertensives. I also advised her to go on a low-salt diet. Followup in the future with stroke nurse practitioner in 6 months or call earlier if necessary

## 2015-09-17 NOTE — Progress Notes (Signed)
[facility] [address] [place]. [place] [address] [phone]       OFFICE FOLLOW-UP NOTE  [name] Date of Birth:  1927/03/11 Medical Record Number:  [id number]   HPI: 82 y.o. Caucasian lady seen today for first office follow-up visit following hospital admission for stroke in November 2016. Kathy Franklin is an 82 y.o. female with a history of CLL, arthritis and hypothyroidism, presenting with new onset left side weakness and numbness. She was last known well at 9 AM on 06/27/2015. There was no previous history of stroke nor TIA. She has not been on antiplatelet therapy. MRI showed a 6 mm acute infarction involving posterior right frontal lobe. NIH score was 2 for sensory changes and mild facial weakness on the left. LSN: 9 AM on 06/27/2015 tPA Given: No: Minimal residual deficits . MRI scan of the brain showed a small 6 mm right frontal white matter lacunar infarct as well as old bilateral basal ganglia infarcts. LDL cholesterol was elevated at 125. She started on Lipitor and aspirin was changed to Plavix. Transthoracic echo showed no cardiac source of embolus and normal ejection fraction. Carotid ultrasound was unremarkable. Hemoglobin A1c was 5.7. Patient was discharged home and did well initially but she was readmitted on 08/30/15 with worsening of subjective left sided weakness when she woke up from sleep. A repeat MRI scan of the brain have personally reviewed showed no acute infarct this time. Patient's medications were continued. She states she'll doing well she is tolerating Plavix with only minor bruising but no bleeding. She does not take any blood pressure medications but today her blood pressure is elevated in office in [id number]. She blames this on being stressed out as she found it difficult to find our office and she had a rough night as her husband was up most of the night. She continues to have some subjective leg heaviness on the left but this could be  from her hip pain. ROS:   14 system review of systems is positive for left-sided numbness, weakness, right hip pain and all the systems negative  PMH:  Past Medical History  Diagnosis Date  . CLL (chronic lymphocytic leukemia) ([place]) 07/20/2011  . Arthritis   . PVC's (premature ventricular contractions)   . Thyroid disease   . Depression   . Stroke [facility])     Social History:  Social History   Social History  . Marital Status: Married    Spouse Name: N/A  . Number of Children: N/A  . Years of Education: N/A   Occupational History  . Not on file.   Social History Main Topics  . Smoking status: Never Smoker   . Smokeless tobacco: Never Used  . Alcohol Use: No  . Drug Use: No  . Sexual Activity: Yes   Other Topics Concern  . Not on file   Social History Narrative    Medications:   Current Outpatient Prescriptions on File Prior to Visit  Medication Sig Dispense Refill  . atorvastatin (LIPITOR) 20 MG tablet Take 1 tablet (20 mg total) by mouth daily at 6 PM. 30 tablet 1  . cholecalciferol (VITAMIN D) 1000 UNITS tablet Take 1,000 Units by mouth every morning.    . clopidogrel (PLAVIX) 75 MG tablet Take 1 tablet (75 mg total) by mouth daily. 30 tablet 0  . Fish Oil-Krill Oil (KRILL OIL PLUS) CAPS Take 1 capsule by mouth daily.     Marland Kitchen GARLIC PO Take 1 tablet by mouth  daily.     . levothyroxine (SYNTHROID, LEVOTHROID) 88 MCG tablet Take 88 mcg by mouth daily before breakfast.     . LORazepam (ATIVAN) 0.5 MG tablet Take 1 tablet by mouth as needed. Anxiety  2   No current facility-administered medications on file prior to visit.    Allergies:   Allergies  Allergen Reactions  . Penicillins Rash    Physical Exam General: Frail elderly Caucasian lady, seated, in no evident distress Head: head normocephalic and atraumatic.  Neck: supple with no carotid or supraclavicular bruits Cardiovascular: irregular rate and rhythm with occasional ectopics, soft ejection  murmur Musculoskeletal: no deformity Skin:  no rash/petichiae Vascular:  Normal pulses all extremities Filed Vitals:   09/17/15 1304  BP: 151/76  Pulse: 61   Neurologic Exam Mental Status: Awake and fully alert. Oriented to place and time. Recent and remote memory intact. Attention span, concentration and fund of knowledge appropriate. Mood and affect appropriate.  Cranial Nerves: Fundoscopic exam reveals sharp disc margins. Pupils equal, briskly reactive to light. Extraocular movements full without nystagmus. Visual fields full to confrontation. Hearing intact. Facial sensation intact. Face, tongue, palate moves normally and symmetrically.  Motor: Normal bulk and tone. Normal strength in all tested extremity muscles. Sensory.: intact to touch ,pinprick .position and vibratory sensation.  Coordination: Rapid alternating movements normal in all extremities. Finger-to-nose and heel-to-shin performed accurately bilaterally. Gait and Station: Arises from chair without difficulty. Stance is normal. Gait demonstrates normal stride length and balance . Able to heel, toe and tandem walk without difficulty.  Reflexes: 1+ and symmetric. Toes downgoing.   NIHSS  0Modified Rankin  1  ASSESSMENT: 82 year lady with small right frontal infarct secondary to small vessel disease in November 2016 who is doing well. Vascular risk factors of hyperlipidemia, age, cerebrovascular disease  PLAN: I had a long d/w patient and daughter about her recent stroke, risk for recurrent stroke/TIAs, personally independently reviewed imaging studies and stroke evaluation results and answered questions.Continue Plavix for secondary stroke prevention and maintain strict control of hypertension with blood pressure goal below 130/90, diabetes with hemoglobin A1c goal below 6.5% and lipids with LDL cholesterol goal below 70 mg/dL. I also advised the patient to eat a healthy diet with plenty of whole grains, cereals, fruits and  vegetables, exercise regularly and maintain ideal body weight .I advised the patient to see her primary physician Dr. Shon Baton monitor her blood pressure if it stays elevated she may need to be started on antihypertensives.Greater than 50% of time during this 25 minute visit was spent on counseling,explanation of diagnosis, planning of further management, discussion with patient and family and coordination of care Antony Contras, MD I also advised her to go on a low-salt diet. Followup in the future with stroke nurse practitioner in 6 months or call earlier if necessary   Note: This document was prepared with digital dictation and possible smart phrase technology. Any transcriptional errors that result from this process are unintentional

## 2015-10-09 ENCOUNTER — Ambulatory Visit (INDEPENDENT_AMBULATORY_CARE_PROVIDER_SITE_OTHER): Payer: Medicare Other | Admitting: Podiatry

## 2015-10-09 ENCOUNTER — Encounter: Payer: Self-pay | Admitting: Podiatry

## 2015-10-09 DIAGNOSIS — L603 Nail dystrophy: Secondary | ICD-10-CM

## 2015-10-09 DIAGNOSIS — L601 Onycholysis: Secondary | ICD-10-CM

## 2015-10-09 DIAGNOSIS — B351 Tinea unguium: Secondary | ICD-10-CM | POA: Diagnosis not present

## 2015-10-09 NOTE — Patient Instructions (Signed)

## 2015-10-12 NOTE — Progress Notes (Signed)
Patient ID: Kathy Franklin, female   DOB: July 04, 1927, 82 y.o.   MRN: [id number]  Subjective: 82 year old female presents the office concerns of left big toenail pain which is been ongoing for the last couple months. After last him and she had some temporary relief for the pain started to recur. She is at the nails pre-was a fallen off and she had a artificial nail applied. Since then she has had pain mostly to the corners of the nail and the nail has been very loose. She says her nails painful particularly with pressure in shoe gear. Denies any drainage or pus. He denies any redness or red streaks. Denies any systemic complaints such as fevers, chills, nausea, vomiting. No acute changes since last appointment, and no other complaints at this time.   Objective: AAO x3, NAD DP/PT pulses palpable bilaterally, CRT less than 3 seconds; pedal hair present Protective sensation intact with Simms Weinstein monofilament There is thickening the left hallux toenail and there does appear to be an artificial nail present. The nail is also incurvated on the lateral nail border and the medial nail border with tenderness to palpation to the entire nail. The nail and the artificial nail both very loose. There is no surrounding edema, erythema, drainage. Remaining nails without pain at this time. No areas of pinpoint bony tenderness or pain with vibratory sensation. MMT 5/5, ROM WNL. No edema, erythema, increase in warmth to bilateral lower extremities.  No open lesions or pre-ulcerative lesions.  No pain with calf compression, swelling, warmth, erythema  Assessment: Left hallux onychodystrophy, onycholysis, symptomatic  Plan: -All treatment options discussed with the patient including all alternatives, risks, complications.  -At this time, the patient is requesting total nail removal with chemical matricectomy. Risks and complications were discussed with the patient for which they understand and  verbally consent  to the procedure. Under sterile conditions a total of 3 mL of a mixture of 2% lidocaine plain and 0.5% Marcaine plain was infiltrated in a hallux block fashion. Once anesthetized, the skin was prepped in sterile fashion. A tourniquet was then applied. Next the left hallux nail was then excised making sure to remove the entire offending nail border. Once the nails were ensured to be removed area was debrided and the underlying skin was intact. There is no purulence identified in the procedure. Next phenol was then applied under standard conditions and copiously irrigated. Silvadene was applied. A dry sterile dressing was applied. After application of the dressing the tourniquet was removed and there is found to be an immediate capillary refill time to the digit. The patient tolerated the procedure well any complications. Post procedure instructions were discussed the patient for which he verbally understood. Follow-up in one week for nail check or sooner if any problems are to arise. Discussed signs/symptoms of infection and directed to call the office immediately should any occur or go directly to the emergency room. In the meantime, encouraged to call the office with any questions, concerns, changes symptoms. -Patient encouraged to call the office with any questions, concerns, change in symptoms.   Celesta Gentile, DPM

## 2015-10-16 ENCOUNTER — Encounter: Payer: Self-pay | Admitting: Podiatry

## 2015-10-16 ENCOUNTER — Ambulatory Visit (INDEPENDENT_AMBULATORY_CARE_PROVIDER_SITE_OTHER): Payer: Medicare Other | Admitting: Podiatry

## 2015-10-16 DIAGNOSIS — L603 Nail dystrophy: Secondary | ICD-10-CM

## 2015-10-16 DIAGNOSIS — Z9889 Other specified postprocedural states: Secondary | ICD-10-CM

## 2015-10-16 DIAGNOSIS — B351 Tinea unguium: Secondary | ICD-10-CM

## 2015-10-16 NOTE — Progress Notes (Signed)
Patient ID: [name], female   DOB: 1926/09/12, 82 y.o.   MRN: [id number]  Subjective: Kathy Franklin is a 82 y.o.  female returns to office today for follow up evaluation after having left Hallux total permanent nail avulsion performed. Patient has been soaking using epsom salts and applying topical antibiotic covered with bandaid daily. He states the area is tender with pressure. Denies any drainage or surrounding redness. Patient denies fevers, chills, nausea, vomiting. Denies any calf pain, chest pain, SOB.   Objective:  Vitals: Reviewed  General: Well developed, nourished, in no acute distress, alert and oriented x3   Dermatology: Skin is warm, dry and supple bilateral. Left hallux bed border appears to be clean, dry, with mild granular tissue and surrounding scab. There is no surrounding erythema, edema, drainage/purulence. The remaining nails appear unremarkable at this time. There are no other lesions or other signs of infection present.  Neurovascular status: Intact. No lower extremity swelling; No pain with calf compression bilateral.  Musculoskeletal: Decreased tenderness to palpation of the left hallux nail bed. Muscular strength within normal limits bilateral.   Assesement and Plan: S/p partial nail avulsion, doing well.   -Continue soaking in epsom salts twice a day followed by antibiotic ointment and a band-aid. Can leave uncovered at night. Continue this until completely healed.  -If the area has not healed in 2 weeks, call the office for follow-up appointment, or sooner if any problems arise.  -Monitor for any signs/symptoms of infection. Call the office immediately if any occur or go directly to the emergency room. Call with any questions/concerns.  Celesta Gentile, DPM

## 2015-10-16 NOTE — Patient Instructions (Signed)

## 2015-12-31 ENCOUNTER — Emergency Department (HOSPITAL_COMMUNITY)
Admission: EM | Admit: 2015-12-31 | Discharge: 2015-12-31 | Disposition: A | Discharge status: Home / Self Care | Payer: Medicare Other | Attending: Emergency Medicine | Admitting: Emergency Medicine

## 2015-12-31 ENCOUNTER — Emergency Department (HOSPITAL_COMMUNITY): Payer: Medicare Other

## 2015-12-31 ENCOUNTER — Encounter (HOSPITAL_COMMUNITY): Payer: Self-pay | Admitting: Emergency Medicine

## 2015-12-31 DIAGNOSIS — Z79899 Other long term (current) drug therapy: Secondary | ICD-10-CM | POA: Insufficient documentation

## 2015-12-31 DIAGNOSIS — R202 Paresthesia of skin: Secondary | ICD-10-CM | POA: Diagnosis not present

## 2015-12-31 DIAGNOSIS — R200 Anesthesia of skin: Secondary | ICD-10-CM | POA: Diagnosis present

## 2015-12-31 LAB — BASIC METABOLIC PANEL WITH GFR
Anion gap: 7 (ref 5–15)
BUN: 20 mg/dL (ref 6–20)
CO2: 28 mmol/L (ref 22–32)
Calcium: 9.4 mg/dL (ref 8.9–10.3)
Chloride: 104 mmol/L (ref 101–111)
Creatinine, Ser: 0.8 mg/dL (ref 0.44–1.00)
GFR calc Af Amer: >60 mL/min
GFR calc non Af Amer: >60 mL/min
Glucose, Bld: 113 mg/dL — ABNORMAL HIGH (ref 65–99)
Potassium: 4.3 mmol/L (ref 3.5–5.1)
Sodium: 139 mmol/L (ref 135–145)

## 2015-12-31 LAB — CBC
HCT: 40.3 % (ref 36.0–46.0)
Hemoglobin: 12.8 g/dL (ref 12.0–15.0)
MCH: 29.8 pg (ref 26.0–34.0)
MCHC: 31.8 g/dL (ref 30.0–36.0)
MCV: 93.9 fL (ref 78.0–100.0)
Platelets: 170 10*3/uL (ref 150–400)
RBC: 4.29 MIL/uL (ref 3.87–5.11)
RDW: 13.8 % (ref 11.5–15.5)
WBC: 5.7 10*3/uL (ref 4.0–10.5)

## 2015-12-31 LAB — CBG MONITORING, ED: Glucose-Capillary: 83 mg/dL (ref 65–99)

## 2015-12-31 LAB — URINALYSIS, ROUTINE W REFLEX MICROSCOPIC
Bilirubin Urine: NEGATIVE
GLUCOSE, UA: NEGATIVE mg/dL
Hgb urine dipstick: NEGATIVE
Ketones, ur: NEGATIVE mg/dL
LEUKOCYTES UA: NEGATIVE
NITRITE: NEGATIVE
PH: 7.5 (ref 5.0–8.0)
Protein, ur: NEGATIVE mg/dL
SPECIFIC GRAVITY, URINE: 1.012 (ref 1.005–1.030)

## 2015-12-31 NOTE — ED Notes (Signed)
CHECKED CBG 83, RN NICOLE INFORMED

## 2015-12-31 NOTE — ED Notes (Signed)
Pt and family updated that MRI could be about 1 more hour.

## 2015-12-31 NOTE — Discharge Instructions (Signed)
Return to the ED with any concerns including weakness of arms or legs, changes in vision or speech, fainting, decreased level of alertness/lethargy, or any other alarming symptoms

## 2015-12-31 NOTE — ED Provider Notes (Signed)
CSN: [id number]     Arrival date & time 12/31/15  1158 History   First MD Initiated Contact with Patient 12/31/15 1217     Chief Complaint  Patient presents with  . Numbness     (Consider location/radiation/quality/duration/timing/severity/associated sxs/prior Treatment) HPI Comments: Pt comes to the ER with cc of paresthesias/numbness to the L side. She has hx of strokes with no residual to the L side. Pt has hx of CLL,, HTN as well, not on any chemo. Pt denies any focal weakness, visual complains, seizures, altered mental status, loss of consciousness, gait instability. She does indicate that her R side is not weak, but feels "heavy." No recent trauma.   ROS 10 Systems reviewed and are negative for acute change except as noted in the HPI.       The history is provided by the patient.    Past Medical History  Diagnosis Date  . CLL (chronic lymphocytic leukemia) ([place]) 07/20/2011  . Arthritis   . PVC's (premature ventricular contractions)   . Thyroid disease   . Depression   . Stroke [facility])    Past Surgical History  Procedure Laterality Date  . Abdominal hysterectomy    . Appendectomy    . Hernia repair     Family History  Problem Relation Age of Onset  . Asthma Neg Hx   . Emphysema Neg Hx   . Stroke Father    Social History  Substance Use Topics  . Smoking status: Never Smoker   . Smokeless tobacco: Never Used  . Alcohol Use: No   OB History    No data available     Review of Systems    Allergies  Penicillins  Home Medications   Prior to Admission medications   Medication Sig Start Date End Date Taking? Authorizing Provider  atorvastatin (LIPITOR) 20 MG tablet Take 1 tablet (20 mg total) by mouth daily at 6 PM. 06/29/15  Yes Orson Eva, MD  Butalbital-APAP-Caffeine 50-325-40 MG capsule TAKE ONE CAPSULE BY MOUTH EVERY 6 HOURS AS NEEDED FOR HEADACHE 08/26/15  Yes Historical Provider, MD  cholecalciferol (VITAMIN D) 1000 UNITS tablet Take 1,000 Units by  mouth every morning.   Yes Historical Provider, MD  clopidogrel (PLAVIX) 75 MG tablet Take 1 tablet (75 mg total) by mouth daily. 08/31/15  Yes Janece Canterbury, MD  Fish Oil-Krill Oil (KRILL OIL PLUS) CAPS Take 1 capsule by mouth daily.    Yes Historical Provider, MD  GARLIC PO Take 1 tablet by mouth daily.    Yes Historical Provider, MD  levothyroxine (SYNTHROID, LEVOTHROID) 88 MCG tablet Take 88 mcg by mouth daily before breakfast.    Yes Historical Provider, MD  LORazepam (ATIVAN) 0.5 MG tablet Take 1 tablet by mouth as needed. Anxiety 06/13/15  Yes Historical Provider, MD  polyethylene glycol powder (GLYCOLAX/MIRALAX) powder MIX 17 GRAMS IN 8 OUNCES OF WATER AND DRINK ONCE DAILY AS NEEDED 09/09/15  Yes Historical Provider, MD   BP 190/63 mmHg  Pulse 56  Temp(Src) 98 F (36.7 C) (Oral)  Resp 17  Ht 5\' 6"  (1.676 m)  Wt 110 lb (49.896 kg)  BMI 17.76 kg/m2  SpO2 97% Physical Exam  Constitutional: She is oriented to person, place, and time. She appears well-developed.  HENT:  Head: Normocephalic and atraumatic.  Eyes: Conjunctivae and EOM are normal. Pupils are equal, round, and reactive to light.  Neck: Normal range of motion. Neck supple.  Cardiovascular: Normal rate, regular rhythm and normal heart sounds.   Pulmonary/Chest:  Effort normal and breath sounds normal. No respiratory distress.  Abdominal: Soft. Bowel sounds are normal. She exhibits no distension. There is no tenderness. There is no rebound and no guarding.  Neurological: She is alert and oriented to person, place, and time.  Subjective numbness L face. Equivocal sensory exam of the lower extremity and the upper extremity - able to discriminate between sharp and dull.   Skin: Skin is warm and dry.  Nursing note and vitals reviewed.   ED Course  Procedures (including critical care time) Labs Review Labs Reviewed  BASIC METABOLIC PANEL - Abnormal; Notable for the following:    Glucose, Bld 113 (*)    All other  components within normal limits  CBC  URINALYSIS, ROUTINE W REFLEX MICROSCOPIC (NOT AT [facility])  CBG MONITORING, ED    Imaging Review Ct Head Wo Contrast  12/31/2015  CLINICAL DATA:  Patient with left arm and leg numbness. Slurred speech. EXAM: CT HEAD WITHOUT CONTRAST TECHNIQUE: Contiguous axial images were obtained from the base of the skull through the vertex without intravenous contrast. COMPARISON:  Brain CT 08/30/2015. FINDINGS: Ventricles and sulci are prominent compatible with atrophy. Old bilateral basal ganglia lacunar infarct. Periventricular subcortical white matter hypodensity compatible with chronic microvascular ischemic changes. No evidence for acute cortically based infarct, intracranial hemorrhage, mass lesion or mass-effect. Orbits are unremarkable. Paranasal sinuses are well aerated. Mastoid air cells unremarkable. Calvarium is intact. IMPRESSION: No acute intracranial process. Chronic microvascular ischemic changes. Electronically Signed   By: Lovey Newcomer M.D.   On: 12/31/2015 13:07   I have personally reviewed and evaluated these images and lab results as part of my medical decision-making.   EKG Interpretation None      MDM   Final diagnoses:  Paresthesia of right upper and lower extremity    DDx includes:  Stroke - ischemic vs. hemorrhagic TIA Neuropathy Myelitis Electrolyte abnormality Neuropathy   Pt with hx of stroke comes in with L sided paresthesias, last normal last night. She has subjective paresthesia only. Ct head is neg. MRI ordered-  If neg, pt can be d/c.  Her neck exam reveals no focal tenderness.     Varney Biles, MD 12/31/15 [phone]

## 2015-12-31 NOTE — ED Notes (Signed)
Pt states 1 week ago she had a numbness in her left leg. Pt states this am she woke up at 530 am and had numbness on her whole left side of her body starting in her face and lips and left arm and leg. Pt seems to have a mild left sided facial droop with clear speech. Pt also reports blurry vision in her left since waking up this morning at 530.

## 2015-12-31 NOTE — ED Notes (Signed)
Pt just returned from MRI.  

## 2015-12-31 NOTE — ED Notes (Signed)
Patient states numbness in L arm and L leg x 1 wk.   Patient states worsened this morning and her MD sent her here to have checked out.   Patient states worsened this morning, states last known normal was "last week".

## 2015-12-31 NOTE — ED Notes (Signed)
Pt is in stable condition upon d/c and ambulates from ED. 

## 2016-03-17 ENCOUNTER — Encounter: Payer: Self-pay | Admitting: Nurse Practitioner

## 2016-03-17 ENCOUNTER — Ambulatory Visit (INDEPENDENT_AMBULATORY_CARE_PROVIDER_SITE_OTHER): Payer: Medicare Other | Admitting: Nurse Practitioner

## 2016-03-17 VITALS — BP 130/80 | HR 72 | Ht 66.0 in | Wt 110.0 lb

## 2016-03-17 DIAGNOSIS — I638 Other cerebral infarction: Secondary | ICD-10-CM | POA: Diagnosis not present

## 2016-03-17 DIAGNOSIS — E785 Hyperlipidemia, unspecified: Secondary | ICD-10-CM

## 2016-03-17 DIAGNOSIS — I6389 Other cerebral infarction: Secondary | ICD-10-CM

## 2016-03-17 NOTE — Progress Notes (Signed)
[facility]  PATIENT: Kathy Franklin DOB: July 06, 1927   REASON FOR VISIT: Follow-up for lacunar infarct HISTORY FROM: Patient    HISTORY OF PRESENT ILLNESS:UPDATE 8/17/17CM  Kathy Franklin, 82 year old female returns for follow-up. She has a history of small right frontal white matter lacunar infarct involving posterior right frontal lobe. She is currently on Plavix without significant bruising. She has not had further stroke or TIA events. She is also on Lipitor and her lipids are followed by Dr. Virgina Jock. She remains very active and independent in activities of daily living, continues to live in her own home. Blood pressure well controlled in the office today at 130/80. She continues to have some residual left-sided numbness that is not bothersome. She returns for reevaluation    HISTORY 09/17/15 PS88 year Caucasian lady seen today for first office follow-up visit following hospital admission for stroke in November 2016. Kathy Franklin is an 82 y.o. female with a history of CLL, arthritis and hypothyroidism, presenting with new onset left side weakness and numbness. She was last known well at 9 AM on 06/27/2015. There was no previous history of stroke nor TIA. She has not been on antiplatelet therapy. MRI showed a 6 mm acute infarction involving posterior right frontal lobe. NIH score was 2 for sensory changes and mild facial weakness on the left. LSN: 9 AM on 06/27/2015 tPA Given: No: Minimal residual deficits . MRI scan of the brain showed a small 6 mm right frontal white matter lacunar infarct as well as old bilateral basal ganglia infarcts. LDL cholesterol was elevated at 125. She started on Lipitor and aspirin was changed to Plavix. Transthoracic echo showed no cardiac source of embolus and normal ejection fraction. Carotid ultrasound was unremarkable. Hemoglobin A1c was 5.7. Patient was discharged home and did well initially but she was readmitted on 08/30/15 with worsening of  subjective left sided weakness when she woke up from sleep. A repeat MRI scan of the brain have personally reviewed showed no acute infarct this time. Patient's medications were continued. She states she'll doing well she is tolerating Plavix with only minor bruising but no bleeding. She does not take any blood pressure medications but today her blood pressure is elevated in office in [id number]. She blames this on being stressed out as she found it difficult to find our office and she had a rough night as her husband was up most of the night. She continues to have some subjective leg heaviness on the left but this could be from her hip pain.  REVIEW OF SYSTEMS: Full 14 system review of systems performed and notable only for those listed, all others are neg:  Constitutional: neg  Cardiovascular: neg Ear/Nose/Throat: neg  Skin: neg Eyes: neg Respiratory: neg Gastroitestinal: neg  Hematology/Lymphatic: Easy bruising  Endocrine: neg Musculoskeletal:neg Allergy/Immunology: neg Neurological: Numbness, dizziness occasionally Psychiatric: neg Sleep : neg   ALLERGIES: Allergies  Allergen Reactions  . Penicillins Rash    HOME MEDICATIONS: Outpatient Medications Prior to Visit  Medication Sig Dispense Refill  . atorvastatin (LIPITOR) 20 MG tablet Take 1 tablet (20 mg total) by mouth daily at 6 PM. 30 tablet 1  . Butalbital-APAP-Caffeine 50-325-40 MG capsule TAKE ONE CAPSULE BY MOUTH EVERY 6 HOURS AS NEEDED FOR HEADACHE  2  . cholecalciferol (VITAMIN D) 1000 UNITS tablet Take 1,000 Units by mouth every morning.    . clopidogrel (PLAVIX) 75 MG tablet Take 1 tablet (75 mg total) by mouth daily. 30 tablet 0  . Fish  Oil-Krill Oil (KRILL OIL PLUS) CAPS Take 1 capsule by mouth daily.     Marland Kitchen GARLIC PO Take 1 tablet by mouth daily.     Marland Kitchen levothyroxine (SYNTHROID, LEVOTHROID) 88 MCG tablet Take 88 mcg by mouth daily before breakfast.     . LORazepam (ATIVAN) 0.5 MG tablet Take 1 tablet by mouth as needed.  Anxiety  2  . polyethylene glycol powder (GLYCOLAX/MIRALAX) powder MIX 17 GRAMS IN 8 OUNCES OF WATER AND DRINK ONCE DAILY AS NEEDED  3   No facility-administered medications prior to visit.     PAST MEDICAL HISTORY: Past Medical History:  Diagnosis Date  . Arthritis   . CLL (chronic lymphocytic leukemia) ([place]) 07/20/2011  . Depression   . PVC's (premature ventricular contractions)   . Stroke ([place])   . Thyroid disease     PAST SURGICAL HISTORY: Past Surgical History:  Procedure Laterality Date  . ABDOMINAL HYSTERECTOMY    . APPENDECTOMY    . HERNIA REPAIR      FAMILY HISTORY: Family History  Problem Relation Age of Onset  . Stroke Father   . Asthma Neg Hx   . Emphysema Neg Hx     SOCIAL HISTORY: Social History   Social History  . Marital status: Married    Spouse name: N/A  . Number of children: N/A  . Years of education: N/A   Occupational History  . Not on file.   Social History Main Topics  . Smoking status: Never Smoker  . Smokeless tobacco: Never Used  . Alcohol use No  . Drug use: No  . Sexual activity: Yes   Other Topics Concern  . Not on file   Social History Narrative  . No narrative on file     PHYSICAL EXAM  Vitals:   03/17/16 1314  BP: 130/80  Pulse: 72  Weight: 110 lb (49.9 kg)  Height: 5\' 6"  (1.676 m)   Body mass index is 17.75 kg/m. General: Frail elderly Caucasian lady, seated, in no evident distress Head: head normocephalic and atraumatic.  Neck: supple with no carotid  bruits Cardiovascular: regular rate and rhythm , soft ejection murmur Musculoskeletal: no deformity Skin:  no rash/petichiae Vascular:  Normal pulses all extremities  Neurological examination  Mental Status: Awake and fully alert. Oriented to place and time. Recent and remote memory intact. Attention span, concentration and fund of knowledge appropriate. Mood and affect appropriate.  Cranial Nerves: Fundoscopic exam reveals sharp disc margins. Pupils  equal, briskly reactive to light. Extraocular movements full without nystagmus. Visual fields full to confrontation. Hearing intact. Facial sensation intact. Face, tongue, palate moves normally and symmetrically.  Motor: Normal bulk and tone. Normal strength in all tested extremity muscles. Sensory.: intact to touch ,pinprick .position and vibratory sensation.  Coordination: Rapid alternating movements normal in all extremities. Finger-to-nose and heel-to-shin performed accurately bilaterally. Gait and Station: Arises from chair without difficulty. Stance is normal. Gait demonstrates normal stride length and balance . Able to heel, toe and tandem walk without difficulty.  Reflexes: 1+ and symmetric. Toes downgoing.   DIAGNOSTIC DATA (LABS, IMAGING, TESTING) - I reviewed patient records, labs, notes, testing and imaging myself where available.  Lab Results  Component Value Date   WBC 5.7 12/31/2015   HGB 12.8 12/31/2015   HCT 40.3 12/31/2015   MCV 93.9 12/31/2015   PLT 170 12/31/2015      Component Value Date/Time   NA 139 12/31/2015 1212   NA 140 05/28/2015 1059   K 4.3  12/31/2015 1212   K 4.4 05/28/2015 1059   CL 104 12/31/2015 1212   CL 105 08/31/2012 1100   CO2 28 12/31/2015 1212   CO2 27 05/28/2015 1059   GLUCOSE 113 (H) 12/31/2015 1212   GLUCOSE 93 05/28/2015 1059   GLUCOSE 80 08/31/2012 1100   BUN 20 12/31/2015 1212   BUN 21.6 05/28/2015 1059   CREATININE 0.80 12/31/2015 1212   CREATININE 0.8 05/28/2015 1059   CALCIUM 9.4 12/31/2015 1212   CALCIUM 9.6 05/28/2015 1059   PROT 6.7 08/30/2015 1035   PROT 6.5 05/28/2015 1059   ALBUMIN 4.2 08/30/2015 1035   ALBUMIN 4.2 05/28/2015 1059   AST 31 08/30/2015 1035   AST 25 05/28/2015 1059   ALT 27 08/30/2015 1035   ALT 20 05/28/2015 1059   ALKPHOS 41 08/30/2015 1035   ALKPHOS 47 05/28/2015 1059   BILITOT 0.3 08/30/2015 1035   BILITOT 0.46 05/28/2015 1059   GFRNONAA >60 12/31/2015 1212   GFRAA >60 12/31/2015 1212   Lab  Results  Component Value Date   CHOL 171 08/31/2015   HDL 69 08/31/2015   LDLCALC 87 08/31/2015   TRIG 74 08/31/2015   CHOLHDL 2.5 08/31/2015   Lab Results  Component Value Date   HGBA1C 5.6 08/31/2015    ASSESSMENT AND PLAN 43 year lady with small right frontal infarct secondary to small vessel disease in November 2016 who is doing well. Vascular risk factors of hyperlipidemia, age, cerebrovascular disease  PLAN: Stressed the importance of management of risk factors to prevent further stroke Continue Plavix for secondary stroke prevention Maintain strict control of hypertension with blood pressure goal below 130/90, today's reading 130/80  Cholesterol with LDL cholesterol less than 70, followed by primary care,   Continue Lipitor Exercise by walking,  eat healthy diet with whole grains,  fresh fruits and vegetables Stay well hydrated  F/U 6 months Dennie Bible, [facility], [facility], APRN  [facility] [address], [place], [place] [address] [phone] and his mother.ssa

## 2016-03-17 NOTE — Patient Instructions (Signed)
Stressed the importance of management of risk factors to prevent further stroke Continue Plavix for secondary stroke prevention Maintain strict control of hypertension with blood pressure goal below 130/90, today's reading 130/80  Cholesterol with LDL cholesterol less than 70, followed by primary care,   Continue Lipitor Exercise by walking, slowly increase , eat healthy diet with whole grains,  fresh fruits and vegetables Stay well hydrated  F/U 6 months

## 2016-03-22 NOTE — Progress Notes (Signed)
I agree with the above plan 

## 2016-05-02 ENCOUNTER — Emergency Department (HOSPITAL_COMMUNITY): Payer: Medicare Other

## 2016-05-02 ENCOUNTER — Encounter (HOSPITAL_COMMUNITY): Payer: Self-pay | Admitting: Emergency Medicine

## 2016-05-02 ENCOUNTER — Emergency Department (HOSPITAL_COMMUNITY)
Admission: EM | Admit: 2016-05-02 | Discharge: 2016-05-02 | Disposition: A | Discharge status: Home / Self Care | Payer: Medicare Other | Attending: Emergency Medicine | Admitting: Emergency Medicine

## 2016-05-02 DIAGNOSIS — E039 Hypothyroidism, unspecified: Secondary | ICD-10-CM | POA: Diagnosis not present

## 2016-05-02 DIAGNOSIS — R531 Weakness: Secondary | ICD-10-CM | POA: Diagnosis not present

## 2016-05-02 DIAGNOSIS — R51 Headache: Secondary | ICD-10-CM | POA: Insufficient documentation

## 2016-05-02 DIAGNOSIS — Z8673 Personal history of transient ischemic attack (TIA), and cerebral infarction without residual deficits: Secondary | ICD-10-CM | POA: Diagnosis not present

## 2016-05-02 DIAGNOSIS — Z79899 Other long term (current) drug therapy: Secondary | ICD-10-CM | POA: Insufficient documentation

## 2016-05-02 DIAGNOSIS — R200 Anesthesia of skin: Secondary | ICD-10-CM | POA: Diagnosis present

## 2016-05-02 DIAGNOSIS — R519 Headache, unspecified: Secondary | ICD-10-CM

## 2016-05-02 DIAGNOSIS — I10 Essential (primary) hypertension: Secondary | ICD-10-CM

## 2016-05-02 LAB — BASIC METABOLIC PANEL
ANION GAP: 9 (ref 5–15)
BUN: 17 mg/dL (ref 6–20)
CHLORIDE: 104 mmol/L (ref 101–111)
CO2: 28 mmol/L (ref 22–32)
Calcium: 9.4 mg/dL (ref 8.9–10.3)
Creatinine, Ser: 0.79 mg/dL (ref 0.44–1.00)
GFR calc non Af Amer: >60 mL/min (ref >60)
Glucose, Bld: 97 mg/dL (ref 65–99)
Potassium: 4.5 mmol/L (ref 3.5–5.1)
Sodium: 141 mmol/L (ref 135–145)

## 2016-05-02 LAB — URINALYSIS, ROUTINE W REFLEX MICROSCOPIC
Bilirubin Urine: NEGATIVE
GLUCOSE, UA: NEGATIVE mg/dL
HGB URINE DIPSTICK: NEGATIVE
Ketones, ur: NEGATIVE mg/dL
Nitrite: POSITIVE — AB
Protein, ur: NEGATIVE mg/dL
SPECIFIC GRAVITY, URINE: 1.013 (ref 1.005–1.030)
pH: 7 (ref 5.0–8.0)

## 2016-05-02 LAB — URINE MICROSCOPIC-ADD ON: RBC / HPF: NONE SEEN RBC/hpf (ref 0–5)

## 2016-05-02 LAB — CBC
HCT: 42 % (ref 36.0–46.0)
HEMOGLOBIN: 13.3 g/dL (ref 12.0–15.0)
MCH: 30.6 pg (ref 26.0–34.0)
MCHC: 31.7 g/dL (ref 30.0–36.0)
MCV: 96.8 fL (ref 78.0–100.0)
Platelets: 173 10*3/uL (ref 150–400)
RBC: 4.34 MIL/uL (ref 3.87–5.11)
RDW: 13.3 % (ref 11.5–15.5)
WBC: 4.7 10*3/uL (ref 4.0–10.5)

## 2016-05-02 LAB — CBG MONITORING, ED: GLUCOSE-CAPILLARY: 94 mg/dL (ref 65–99)

## 2016-05-02 MED ORDER — OXYCODONE-ACETAMINOPHEN 5-325 MG PO TABS
1.0000 | ORAL_TABLET | Freq: Once | ORAL | Status: AC
  Administered 2016-05-02: 1 via ORAL
  Filled 2016-05-02: qty 1

## 2016-05-02 NOTE — ED Triage Notes (Signed)
Pt states yesterday morning she stared having a frontal aching headache and numbness in left arm and leg. Pt reports in feels heavy on that side. Pt states her numbness is better today but still present. Pt has mild drift in left arm and decreased sensation in left arm and lower leg. NIH 2. Speech clear. Alert and ox4. Pt reports hx of stroke and on Plavix.

## 2016-05-02 NOTE — ED Notes (Signed)
Report given to ruth rn

## 2016-05-02 NOTE — ED Provider Notes (Addendum)
[facility] DEPT Provider Note   CSN: [id number] Arrival date & time: 05/02/16  1117     History   Chief Complaint Chief Complaint  Patient presents with  . Numbness    HPI IVADEAN WIEN is a 82 y.o. female.  HPI 82 year old female who presents today complaining of headache with some numbness in her left arm and left leg. She has had similar headaches in the past. She has also had similar symptoms a stroke in the past. States stroke occurred approximately year ago and she has had some waxing and waning of left arm and left leg numbness since that time. She has frequently gotten headaches and is usually able to control them with one Fioricet. She called her primary care provider, Dr. Virgina Jock, to see if she could take a second Fioricet and she is told to come to ED have a head CT to make sure she did not have any bleeding. She denies any change in vision, neck pain, chest pain, dyspnea, abdominal pain, nausea, vomiting, diarrhea, vision change, speech change, or change in ability to walk. Past Medical History:  Diagnosis Date  . Arthritis   . CLL (chronic lymphocytic leukemia) ([place]) 07/20/2011  . Depression   . PVC's (premature ventricular contractions)   . Stroke ([place])   . Thyroid disease     Patient Active Problem List   Diagnosis Date Noted  . Right facial numbness 08/30/2015  . Left-sided weakness 08/30/2015  . Left hip pain 08/30/2015  . TIA (transient ischemic attack) 08/30/2015  . Dyslipidemia 06/28/2015  . Cerebral infarction due to unspecified mechanism   . CVA (cerebral infarction) 06/27/2015  . Hypothyroidism 06/27/2015  . Chest pain 07/22/2014  . Pulmonary infiltrates 10/22/2013  . Protein-calorie malnutrition, severe ([place]) 09/10/2013  . CAP (community acquired pneumonia) 09/09/2013  . CLL (chronic lymphocytic leukemia) ([place]) 07/20/2011    Past Surgical History:  Procedure Laterality Date  . ABDOMINAL HYSTERECTOMY    . APPENDECTOMY    . HERNIA REPAIR       OB History    No data available       Home Medications    Prior to Admission medications   Medication Sig Start Date End Date Taking? Authorizing Provider  atorvastatin (LIPITOR) 20 MG tablet Take 1 tablet (20 mg total) by mouth daily at 6 PM. 06/29/15  Yes Orson Eva, MD  Butalbital-APAP-Caffeine 50-325-40 MG capsule TAKE ONE CAPSULE BY MOUTH EVERY 6 HOURS AS NEEDED FOR HEADACHE 08/26/15  Yes Historical Provider, MD  cholecalciferol (VITAMIN D) 1000 UNITS tablet Take 1,000 Units by mouth every morning.   Yes Historical Provider, MD  clopidogrel (PLAVIX) 75 MG tablet Take 1 tablet (75 mg total) by mouth daily. 08/31/15  Yes Janece Canterbury, MD  Cyanocobalamin (VITAMIN B 12 PO) Take 1 tablet by mouth daily.   Yes Historical Provider, MD  Fish Oil-Krill Oil (KRILL OIL PLUS) CAPS Take 1 capsule by mouth daily.    Yes Historical Provider, MD  GARLIC PO Take 1 tablet by mouth daily.    Yes Historical Provider, MD  levothyroxine (SYNTHROID, LEVOTHROID) 88 MCG tablet Take 88 mcg by mouth daily before breakfast.    Yes Historical Provider, MD  LORazepam (ATIVAN) 0.5 MG tablet Take 1 tablet by mouth as needed. Anxiety 06/13/15  Yes Historical Provider, MD  polyethylene glycol powder (GLYCOLAX/MIRALAX) powder MIX 17 GRAMS IN 8 OUNCES OF WATER AND DRINK ONCE DAILY AS NEEDED 09/09/15  Yes Historical Provider, MD    Family History Family History  Problem Relation Age of Onset  . Stroke Father   . Asthma Neg Hx   . Emphysema Neg Hx     Social History Social History  Substance Use Topics  . Smoking status: Never Smoker  . Smokeless tobacco: Never Used  . Alcohol use No     Allergies   Penicillins   Review of Systems Review of Systems  All other systems reviewed and are negative.    Physical Exam Updated Vital Signs BP 158/93   Pulse 60   Temp 98.2 F (36.8 C)   Resp 12   Ht 5\' 4"  (1.626 m)   Wt 49.9 kg   SpO2 98%   BMI 18.88 kg/m   Physical Exam  Constitutional: She  is oriented to person, place, and time. She appears well-developed and well-nourished. No distress.  HENT:  Head: Normocephalic and atraumatic.  Eyes: EOM are normal. Pupils are equal, round, and reactive to light.  Neck: Normal range of motion.  Cardiovascular: Normal rate, regular rhythm, normal heart sounds and intact distal pulses.   Pulmonary/Chest: Effort normal and breath sounds normal.  Abdominal: Soft. Bowel sounds are normal.  Musculoskeletal: Normal range of motion.  Neurological: She is alert and oriented to person, place, and time. She has normal reflexes. She displays normal reflexes. No cranial nerve deficit. She exhibits normal muscle tone. Coordination normal.  Skin: Skin is warm and dry. Capillary refill takes less than 2 seconds.  Psychiatric: She has a normal mood and affect.  Nursing note and vitals reviewed.    ED Treatments / Results  Labs (all labs ordered are listed, but only abnormal results are displayed) Labs Reviewed  BASIC METABOLIC PANEL  CBC  URINALYSIS, ROUTINE W REFLEX MICROSCOPIC (NOT AT [facility])  CBG MONITORING, ED    EKG  EKG Interpretation  Date/Time:  Monday May 02 2016 11:24:43 EDT Ventricular Rate:  63 PR Interval:  182 QRS Duration: 66 QT Interval:  428 QTC Calculation: 437 R Axis:   -41 Text Interpretation:  Sinus rhythm with Premature supraventricular complexes Left axis deviation Low voltage QRS Septal infarct , age undetermined Abnormal ECG Confirmed by Lenn Volker MD, Andee Poles [phone]) on 05/02/2016 3:40:41 PM       Radiology Ct Head Wo Contrast  Result Date: 05/02/2016 CLINICAL DATA:  Awoke with LEFT side numbness and some LEFT arm pain, headache, history stroke, CLL EXAM: CT HEAD WITHOUT CONTRAST TECHNIQUE: Contiguous axial images were obtained from the base of the skull through the vertex without intravenous contrast. COMPARISON:  12/31/2015 FINDINGS: Brain: Mild generalized atrophy. Normal ventricular morphology. No midline shift  or mass effect. Minimal small vessel chronic ischemic changes of deep cerebral white matter. Old lacunar infarct posterior LEFT basal ganglia. Benign-appearing basal ganglia calcifications stable. Otherwise normal appearance of brain parenchyma. No intracranial hemorrhage, mass lesion, or acute infarction. No extra-axial fluid collections. Vascular: Unremarkable Skull: Intact Sinuses/Orbits: Clear Other: N/A IMPRESSION: Generalized atrophy with small vessel chronic ischemic changes of deep cerebral white matter. Old LEFT basal ganglia lacunar infarct. No acute intracranial abnormalities. Electronically Signed   By: Lavonia Dana M.D.   On: 05/02/2016 14:09   Mr Brain Wo Contrast  Result Date: 05/02/2016 CLINICAL DATA:  82 year old female with headache and dizziness. History of chronic lymphocytic leukemia. Subsequent encounter. EXAM: MRI HEAD WITHOUT CONTRAST TECHNIQUE: Multiplanar, multiecho pulse sequences of the brain and surrounding structures were obtained without intravenous contrast. COMPARISON:  05/02/2016 head CT.  12/31/2015 brain MR. FINDINGS: Brain: No acute infarct or intracranial hemorrhage. Remote  tiny basal ganglia infarcts bilaterally. Mild chronic microvascular changes. Global atrophy without evidence of hydrocephalus. No intracranial mass lesion noted on this unenhanced exam. Vascular: Diminutive size right vertebral artery once again noted appearing to end in a posterior inferior cerebellar artery distribution. Major intracranial vascular structures are patent. Skull and upper cervical spine: Degenerative changes upper cervical spine with spinal stenosis C2-3. Sinuses/Orbits: No acute orbital abnormality. Minimal mucosal thickening ethmoid sinus air cells. Other: Negative. IMPRESSION: Exam is motion degraded. No acute infarct. Remote tiny basal ganglia infarcts and chronic microvascular changes. Global atrophy without hydrocephalus. Electronically Signed   By: Genia Del M.D.   On:  05/02/2016 19:30    Procedures Procedures (including critical care time)  Medications Ordered in ED Medications  oxyCODONE-acetaminophen (PERCOCET/ROXICET) 5-325 MG per tablet 1 tablet (1 tablet Oral Given 05/02/16 1614)     Initial Impression / Assessment and Plan / ED Course  I have reviewed the triage vital signs and the nursing notes.  Pertinent labs & imaging results that were available during my care of the patient were reviewed by me and considered in my medical decision making (see chart for details).  Clinical Course  4:47 PM Patien rechecked, no neuro change.  Discussed with daughter and has same history as above.   Patient with resolution of headache after one Percocet. MRI obtained shows no evidence of stroke or hemorrhage. Patient advised to return if symptoms worsen. Final Clinical Impressions(s) / ED Diagnoses   Final diagnoses:  Nonintractable episodic headache, unspecified headache type  Weakness  Hypertension, unspecified type    New Prescriptions New Prescriptions   No medications on file     Pattricia Boss, MD 05/02/16 1949    Pattricia Boss, MD 05/02/16 [id number]

## 2016-05-02 NOTE — ED Notes (Signed)
Pt updated that MRI states scan should be within the hr.

## 2016-05-02 NOTE — ED Notes (Signed)
Dr. Jeanell Sparrow went to pts bedside and after talking with pt and daughter pt is agreeable to stay and have scan.

## 2016-05-02 NOTE — ED Notes (Signed)
Pt states she does not want to wait for MRI doesn't "feel I need it". Pt made aware she would be seen within the hour-still refuses. Pt made aware of risk in not receiving recommended treatment. MD notified.

## 2016-05-02 NOTE — ED Notes (Signed)
MD at bedside. 

## 2016-05-02 NOTE — Discharge Instructions (Signed)
Follow up with Dr. Virgina Jock this week for rexam and recheck of blood pressure. Return if worse at any time.

## 2016-05-13 ENCOUNTER — Other Ambulatory Visit (HOSPITAL_COMMUNITY): Payer: Self-pay | Admitting: *Deleted

## 2016-05-16 ENCOUNTER — Ambulatory Visit (HOSPITAL_COMMUNITY): Payer: Medicare Other

## 2016-06-06 ENCOUNTER — Emergency Department (HOSPITAL_COMMUNITY): Payer: Medicare Other | Admitting: Certified Registered"

## 2016-06-06 ENCOUNTER — Ambulatory Visit (INDEPENDENT_AMBULATORY_CARE_PROVIDER_SITE_OTHER): Payer: Medicare Other

## 2016-06-06 ENCOUNTER — Encounter (HOSPITAL_COMMUNITY): Admission: EM | Disposition: E | Payer: Self-pay | Source: Home / Self Care

## 2016-06-06 ENCOUNTER — Emergency Department (HOSPITAL_COMMUNITY): Payer: Medicare Other

## 2016-06-06 ENCOUNTER — Encounter: Payer: Self-pay | Admitting: Podiatry

## 2016-06-06 ENCOUNTER — Ambulatory Visit (INDEPENDENT_AMBULATORY_CARE_PROVIDER_SITE_OTHER): Payer: Medicare Other | Admitting: Podiatry

## 2016-06-06 ENCOUNTER — Inpatient Hospital Stay (HOSPITAL_COMMUNITY)
Admission: EM | Admit: 2016-06-06 | Discharge: 2016-07-01 | DRG: 957 | Disposition: E | Payer: Medicare Other | Attending: General Surgery | Admitting: General Surgery

## 2016-06-06 ENCOUNTER — Encounter (HOSPITAL_COMMUNITY): Payer: Self-pay | Admitting: Radiology

## 2016-06-06 DIAGNOSIS — S99922A Unspecified injury of left foot, initial encounter: Secondary | ICD-10-CM | POA: Diagnosis not present

## 2016-06-06 DIAGNOSIS — S32009A Unspecified fracture of unspecified lumbar vertebra, initial encounter for closed fracture: Secondary | ICD-10-CM | POA: Diagnosis present

## 2016-06-06 DIAGNOSIS — Z515 Encounter for palliative care: Secondary | ICD-10-CM | POA: Diagnosis present

## 2016-06-06 DIAGNOSIS — S225XXA Flail chest, initial encounter for closed fracture: Secondary | ICD-10-CM | POA: Diagnosis present

## 2016-06-06 DIAGNOSIS — N179 Acute kidney failure, unspecified: Secondary | ICD-10-CM | POA: Diagnosis present

## 2016-06-06 DIAGNOSIS — S27809A Unspecified injury of diaphragm, initial encounter: Secondary | ICD-10-CM | POA: Diagnosis present

## 2016-06-06 DIAGNOSIS — Y92410 Unspecified street and highway as the place of occurrence of the external cause: Secondary | ICD-10-CM | POA: Diagnosis not present

## 2016-06-06 DIAGNOSIS — J9600 Acute respiratory failure, unspecified whether with hypoxia or hypercapnia: Secondary | ICD-10-CM | POA: Diagnosis present

## 2016-06-06 DIAGNOSIS — S32029A Unspecified fracture of second lumbar vertebra, initial encounter for closed fracture: Secondary | ICD-10-CM | POA: Diagnosis present

## 2016-06-06 DIAGNOSIS — S62501A Fracture of unspecified phalanx of right thumb, initial encounter for closed fracture: Secondary | ICD-10-CM | POA: Diagnosis present

## 2016-06-06 DIAGNOSIS — S32491A Other specified fracture of right acetabulum, initial encounter for closed fracture: Secondary | ICD-10-CM | POA: Diagnosis present

## 2016-06-06 DIAGNOSIS — R52 Pain, unspecified: Secondary | ICD-10-CM | POA: Diagnosis present

## 2016-06-06 DIAGNOSIS — R571 Hypovolemic shock: Secondary | ICD-10-CM | POA: Diagnosis present

## 2016-06-06 DIAGNOSIS — I712 Thoracic aortic aneurysm, without rupture: Secondary | ICD-10-CM | POA: Diagnosis present

## 2016-06-06 DIAGNOSIS — S272XXA Traumatic hemopneumothorax, initial encounter: Secondary | ICD-10-CM | POA: Diagnosis present

## 2016-06-06 DIAGNOSIS — Z66 Do not resuscitate: Secondary | ICD-10-CM | POA: Diagnosis present

## 2016-06-06 DIAGNOSIS — J9811 Atelectasis: Secondary | ICD-10-CM | POA: Diagnosis present

## 2016-06-06 DIAGNOSIS — Z8673 Personal history of transient ischemic attack (TIA), and cerebral infarction without residual deficits: Secondary | ICD-10-CM | POA: Diagnosis not present

## 2016-06-06 DIAGNOSIS — S27808A Other injury of diaphragm, initial encounter: Secondary | ICD-10-CM | POA: Diagnosis present

## 2016-06-06 DIAGNOSIS — D65 Disseminated intravascular coagulation [defibrination syndrome]: Secondary | ICD-10-CM | POA: Diagnosis present

## 2016-06-06 DIAGNOSIS — D62 Acute posthemorrhagic anemia: Secondary | ICD-10-CM | POA: Diagnosis present

## 2016-06-06 DIAGNOSIS — Z880 Allergy status to penicillin: Secondary | ICD-10-CM

## 2016-06-06 DIAGNOSIS — Z9889 Other specified postprocedural states: Secondary | ICD-10-CM

## 2016-06-06 DIAGNOSIS — S32019A Unspecified fracture of first lumbar vertebra, initial encounter for closed fracture: Secondary | ICD-10-CM | POA: Diagnosis present

## 2016-06-06 DIAGNOSIS — Z856 Personal history of leukemia: Secondary | ICD-10-CM

## 2016-06-06 DIAGNOSIS — I959 Hypotension, unspecified: Secondary | ICD-10-CM | POA: Diagnosis present

## 2016-06-06 DIAGNOSIS — J9690 Respiratory failure, unspecified, unspecified whether with hypoxia or hypercapnia: Secondary | ICD-10-CM

## 2016-06-06 DIAGNOSIS — S32401A Unspecified fracture of right acetabulum, initial encounter for closed fracture: Secondary | ICD-10-CM | POA: Diagnosis present

## 2016-06-06 DIAGNOSIS — S2241XA Multiple fractures of ribs, right side, initial encounter for closed fracture: Secondary | ICD-10-CM | POA: Diagnosis present

## 2016-06-06 DIAGNOSIS — S270XXA Traumatic pneumothorax, initial encounter: Secondary | ICD-10-CM | POA: Diagnosis not present

## 2016-06-06 DIAGNOSIS — K56609 Unspecified intestinal obstruction, unspecified as to partial versus complete obstruction: Secondary | ICD-10-CM | POA: Diagnosis present

## 2016-06-06 HISTORY — PX: LAPAROTOMY: SHX154

## 2016-06-06 HISTORY — DX: Leukemia, unspecified not having achieved remission: C95.90

## 2016-06-06 LAB — POCT I-STAT 7, (LYTES, BLD GAS, ICA,H+H)
ACID-BASE DEFICIT: 5 mmol/L — AB (ref 0.0–2.0)
Acid-base deficit: 15 mmol/L — ABNORMAL HIGH (ref 0.0–2.0)
Acid-base deficit: 3 mmol/L — ABNORMAL HIGH (ref 0.0–2.0)
BICARBONATE: 16.3 mmol/L — AB (ref 20.0–28.0)
BICARBONATE: 23.3 mmol/L (ref 20.0–28.0)
Bicarbonate: 24.5 mmol/L (ref 20.0–28.0)
CALCIUM ION: 0.9 mmol/L — AB (ref 1.15–1.40)
Calcium, Ion: 0.48 mmol/L — CL (ref 1.15–1.40)
Calcium, Ion: 1.28 mmol/L (ref 1.15–1.40)
HEMATOCRIT: 20 % — AB (ref 36.0–46.0)
HEMATOCRIT: 27 % — AB (ref 36.0–46.0)
HEMATOCRIT: 22 % — AB (ref 36.0–46.0)
HEMOGLOBIN: 6.8 g/dL — AB (ref 12.0–15.0)
Hemoglobin: 9.2 g/dL — ABNORMAL LOW (ref 12.0–15.0)
Hemoglobin: 7.5 g/dL — ABNORMAL LOW (ref 12.0–15.0)
O2 SAT: 100 %
O2 SAT: 99 %
O2 Saturation: 100 %
PCO2 ART: 66.4 mmHg — AB (ref 32.0–48.0)
PH ART: 6.997 — AB (ref 7.350–7.450)
PH ART: 7.18 — AB (ref 7.350–7.450)
POTASSIUM: 3.7 mmol/L (ref 3.5–5.1)
Patient temperature: 34.9
Potassium: 4.8 mmol/L (ref 3.5–5.1)
Potassium: 4.6 mmol/L (ref 3.5–5.1)
SODIUM: 147 mmol/L — AB (ref 135–145)
SODIUM: 146 mmol/L — AB (ref 135–145)
Sodium: 143 mmol/L (ref 135–145)
TCO2: 18 mmol/L (ref 0–100)
TCO2: 26 mmol/L (ref 0–100)
TCO2: 25 mmol/L (ref 0–100)
pCO2 arterial: 59.4 mmHg — ABNORMAL HIGH (ref 32.0–48.0)
pCO2 arterial: 60.7 mmHg — ABNORMAL HIGH (ref 32.0–48.0)
pH, Arterial: 7.222 — ABNORMAL LOW (ref 7.350–7.450)
pO2, Arterial: 238 mmHg — ABNORMAL HIGH (ref 83.0–108.0)
pO2, Arterial: 308 mmHg — ABNORMAL HIGH (ref 83.0–108.0)
pO2, Arterial: 155 mmHg — ABNORMAL HIGH (ref 83.0–108.0)

## 2016-06-06 LAB — COMPREHENSIVE METABOLIC PANEL
ALBUMIN: 3.3 g/dL — AB (ref 3.5–5.0)
ALK PHOS: 38 U/L (ref 38–126)
ALT: 65 U/L — ABNORMAL HIGH (ref 14–54)
ANION GAP: 12 (ref 5–15)
AST: 113 U/L — AB (ref 15–41)
BILIRUBIN TOTAL: 0.8 mg/dL (ref 0.3–1.2)
BUN: 29 mg/dL — AB (ref 6–20)
CALCIUM: 9.1 mg/dL (ref 8.9–10.3)
CO2: 20 mmol/L — ABNORMAL LOW (ref 22–32)
CREATININE: 1.14 mg/dL — AB (ref 0.44–1.00)
Chloride: 105 mmol/L (ref 101–111)
GFR calc Af Amer: 49 mL/min — ABNORMAL LOW (ref >60)
GFR calc non Af Amer: 42 mL/min — ABNORMAL LOW (ref >60)
GLUCOSE: 396 mg/dL — AB (ref 65–99)
Potassium: 4.1 mmol/L (ref 3.5–5.1)
Sodium: 137 mmol/L (ref 135–145)
TOTAL PROTEIN: 4.8 g/dL — AB (ref 6.5–8.1)

## 2016-06-06 LAB — CBC
HCT: 35.6 % — ABNORMAL LOW (ref 36.0–46.0)
Hemoglobin: 11.2 g/dL — ABNORMAL LOW (ref 12.0–15.0)
MCH: 30.4 pg (ref 26.0–34.0)
MCHC: 31.5 g/dL (ref 30.0–36.0)
MCV: 96.7 fL (ref 78.0–100.0)
PLATELETS: 187 10*3/uL (ref 150–400)
RBC: 3.68 MIL/uL — ABNORMAL LOW (ref 3.87–5.11)
RDW: 13.7 % (ref 11.5–15.5)
WBC: 15.3 10*3/uL — ABNORMAL HIGH (ref 4.0–10.5)

## 2016-06-06 LAB — PROTIME-INR
INR: 1.56
PROTHROMBIN TIME: 18.8 s — AB (ref 11.4–15.2)

## 2016-06-06 LAB — I-STAT CHEM 8, ED
BUN: 30 mg/dL — ABNORMAL HIGH (ref 6–20)
CALCIUM ION: 1.24 mmol/L (ref 1.15–1.40)
Chloride: 104 mmol/L (ref 101–111)
Creatinine, Ser: 1 mg/dL (ref 0.44–1.00)
Glucose, Bld: 287 mg/dL — ABNORMAL HIGH (ref 65–99)
HEMATOCRIT: 32 % — AB (ref 36.0–46.0)
HEMOGLOBIN: 10.9 g/dL — AB (ref 12.0–15.0)
Potassium: 3.6 mmol/L (ref 3.5–5.1)
SODIUM: 140 mmol/L (ref 135–145)
TCO2: 23 mmol/L (ref 0–100)

## 2016-06-06 LAB — ABO/RH: ABO/RH(D): A POS

## 2016-06-06 LAB — ETHANOL

## 2016-06-06 LAB — I-STAT CG4 LACTIC ACID, ED: LACTIC ACID, VENOUS: 7.4 mmol/L — AB (ref 0.5–1.9)

## 2016-06-06 LAB — MASSIVE TRANSFUSION PROTOCOL ORDER (BLOOD BANK NOTIFICATION)

## 2016-06-06 SURGERY — LAPAROTOMY, EXPLORATORY
Anesthesia: General | Site: Abdomen | Laterality: Bilateral

## 2016-06-06 MED ORDER — CEFAZOLIN SODIUM-DEXTROSE 2-3 GM-% IV SOLR
INTRAVENOUS | Status: DC | PRN
  Administered 2016-06-06: 2 g via INTRAVENOUS

## 2016-06-06 MED ORDER — NOREPINEPHRINE BITARTRATE 1 MG/ML IV SOLN: 0.0000 ug/min | INTRAVENOUS | Status: DC

## 2016-06-06 MED ORDER — IOPAMIDOL (ISOVUE-300) INJECTION 61%
100.0000 mL | Freq: Once | INTRAVENOUS | Status: AC | PRN
  Administered 2016-06-06: 100 mL via INTRAVENOUS

## 2016-06-06 MED ORDER — CALCIUM CHLORIDE 10 % IV SOLN
INTRAVENOUS | Status: DC | PRN
  Administered 2016-06-06 (×2): 1 g via INTRAVENOUS

## 2016-06-06 MED ORDER — FENTANYL CITRATE (PF) 100 MCG/2ML IJ SOLN
INTRAMUSCULAR | Status: AC | PRN
  Administered 2016-06-06: 50 ug via INTRAVENOUS
  Administered 2016-06-06: 25 ug via INTRAVENOUS

## 2016-06-06 MED ORDER — KETAMINE HCL-SODIUM CHLORIDE 100-0.9 MG/10ML-% IV SOSY
PREFILLED_SYRINGE | INTRAVENOUS | Status: AC
  Administered 2016-06-06: 80 mg
  Filled 2016-06-06: qty 10

## 2016-06-06 MED ORDER — ROCURONIUM BROMIDE 100 MG/10ML IV SOLN
INTRAVENOUS | Status: DC | PRN
  Administered 2016-06-06 (×2): 50 mg via INTRAVENOUS

## 2016-06-06 MED ORDER — MIDAZOLAM HCL 2 MG/2ML IJ SOLN
INTRAMUSCULAR | Status: AC
  Filled 2016-06-06: qty 2

## 2016-06-06 MED ORDER — INSULIN ASPART 100 UNIT/ML ~~LOC~~ SOLN
0.0000 [IU] | Freq: Three times a day (TID) | SUBCUTANEOUS | Status: DC
  Administered 2016-06-07: 2 [IU] via SUBCUTANEOUS

## 2016-06-06 MED ORDER — PANTOPRAZOLE SODIUM 40 MG PO TBEC
40.0000 mg | DELAYED_RELEASE_TABLET | Freq: Two times a day (BID) | ORAL | Status: DC
Start: 2016-06-06 — End: 2016-06-07

## 2016-06-06 MED ORDER — FAMOTIDINE IN NACL 20-0.9 MG/50ML-% IV SOLN: 20.0000 mg | Freq: Two times a day (BID) | INTRAVENOUS | Status: DC

## 2016-06-06 MED ORDER — SODIUM CHLORIDE 0.9 % IV SOLN: INTRAVENOUS | Status: DC

## 2016-06-06 MED ORDER — FENTANYL CITRATE (PF) 100 MCG/2ML IJ SOLN
INTRAMUSCULAR | Status: AC
  Filled 2016-06-06: qty 2

## 2016-06-06 MED ORDER — NOREPINEPHRINE BITARTRATE 1 MG/ML IV SOLN
0.0000 ug/min | INTRAVENOUS | Status: DC
  Administered 2016-06-06: 30 ug/min via INTRAVENOUS
  Filled 2016-06-06: qty 4

## 2016-06-06 MED ORDER — FENTANYL CITRATE (PF) 100 MCG/2ML IJ SOLN: 50.0000 ug | INTRAMUSCULAR | Status: DC | PRN

## 2016-06-06 MED ORDER — TRANEXAMIC ACID 1000 MG/10ML IV SOLN
1000.0000 mg | Freq: Once | INTRAVENOUS | Status: AC
  Administered 2016-06-06: 1000 mg via INTRAVENOUS
  Filled 2016-06-06: qty 10

## 2016-06-06 MED ORDER — SODIUM CHLORIDE 0.9 % IV BOLUS (SEPSIS)
1000.0000 mL | Freq: Once | INTRAVENOUS | Status: AC
  Administered 2016-06-06: 1000 mL via INTRAVENOUS

## 2016-06-06 MED ORDER — PROPOFOL 1000 MG/100ML IV EMUL
INTRAVENOUS | Status: AC
  Filled 2016-06-06: qty 100

## 2016-06-06 MED ORDER — TRANEXAMIC ACID 1000 MG/10ML IV SOLN
1000.0000 mg | Freq: Once | INTRAVENOUS | Status: AC
  Administered 2016-06-06 (×2): 1000 mg via INTRAVENOUS
  Filled 2016-06-06: qty 10

## 2016-06-06 MED ORDER — CHLORHEXIDINE GLUCONATE 0.12% ORAL RINSE (MEDLINE KIT)
15.0000 mL | Freq: Two times a day (BID) | OROMUCOSAL | Status: DC
  Administered 2016-06-07: 15 mL via OROMUCOSAL

## 2016-06-06 MED ORDER — MIDAZOLAM HCL 5 MG/5ML IJ SOLN
INTRAMUSCULAR | Status: DC | PRN
  Administered 2016-06-06: 2 mg via INTRAVENOUS

## 2016-06-06 MED ORDER — LACTATED RINGERS IV SOLN
INTRAVENOUS | Status: DC | PRN
  Administered 2016-06-06: 21:00:00 via INTRAVENOUS

## 2016-06-06 MED ORDER — NOREPINEPHRINE BITARTRATE 1 MG/ML IV SOLN
0.0000 ug/min | INTRAVENOUS | Status: DC
  Filled 2016-06-06 (×2): qty 4

## 2016-06-06 MED ORDER — ONDANSETRON HCL 4 MG PO TABS: 4.0000 mg | ORAL_TABLET | Freq: Four times a day (QID) | ORAL | Status: DC | PRN

## 2016-06-06 MED ORDER — TRANEXAMIC ACID 1000 MG/10ML IV SOLN: INTRAVENOUS | Status: DC | PRN

## 2016-06-06 MED ORDER — FENTANYL CITRATE (PF) 100 MCG/2ML IJ SOLN
INTRAMUSCULAR | Status: DC | PRN
  Administered 2016-06-06 (×4): 50 ug via INTRAVENOUS

## 2016-06-06 MED ORDER — SENNOSIDES 8.8 MG/5ML PO SYRP: 5.0000 mL | ORAL_SOLUTION | Freq: Two times a day (BID) | ORAL | Status: DC | PRN

## 2016-06-06 MED ORDER — PHENYLEPHRINE HCL 10 MG/ML IJ SOLN
INTRAMUSCULAR | Status: DC | PRN
  Administered 2016-06-06: 160 ug via INTRAVENOUS

## 2016-06-06 MED ORDER — NOREPINEPHRINE BITARTRATE 1 MG/ML IV SOLN
INTRAVENOUS | Status: DC | PRN
  Administered 2016-06-06: 30 ug/min via INTRAVENOUS

## 2016-06-06 MED ORDER — ORAL CARE MOUTH RINSE
15.0000 mL | OROMUCOSAL | Status: DC
  Administered 2016-06-07 (×3): 15 mL via OROMUCOSAL

## 2016-06-06 MED ORDER — PROPOFOL 500 MG/50ML IV EMUL
INTRAVENOUS | Status: DC | PRN
  Administered 2016-06-06: 30 ug/kg/min via INTRAVENOUS

## 2016-06-06 MED ORDER — SODIUM CHLORIDE 0.9 % IV SOLN
INTRAVENOUS | Status: DC | PRN
  Administered 2016-06-06 (×2): via INTRAVENOUS

## 2016-06-06 MED ORDER — TETANUS-DIPHTH-ACELL PERTUSSIS 5-2.5-18.5 LF-MCG/0.5 IM SUSP: 0.5000 mL | Freq: Once | INTRAMUSCULAR | Status: DC

## 2016-06-06 MED ORDER — ALBUMIN HUMAN 5 % IV SOLN
INTRAVENOUS | Status: DC | PRN
  Administered 2016-06-06 (×2): via INTRAVENOUS

## 2016-06-06 MED ORDER — FENTANYL CITRATE (PF) 100 MCG/2ML IJ SOLN
25.0000 ug | Freq: Once | INTRAMUSCULAR | Status: AC
  Administered 2016-06-06: 25 ug via INTRAVENOUS

## 2016-06-06 MED ORDER — ONDANSETRON HCL 4 MG/2ML IJ SOLN: 4.0000 mg | Freq: Four times a day (QID) | INTRAMUSCULAR | Status: DC | PRN

## 2016-06-06 MED ORDER — SUCCINYLCHOLINE CHLORIDE 20 MG/ML IJ SOLN
INTRAMUSCULAR | Status: AC | PRN
  Administered 2016-06-06: 70 mg via INTRAVENOUS

## 2016-06-06 MED ORDER — MIDAZOLAM HCL 2 MG/2ML IJ SOLN
1.0000 mg | Freq: Once | INTRAMUSCULAR | Status: AC
  Administered 2016-06-06: 1 mg via INTRAVENOUS

## 2016-06-06 MED ORDER — 0.9 % SODIUM CHLORIDE (POUR BTL) OPTIME
TOPICAL | Status: DC | PRN
  Administered 2016-06-06: 1000 mL

## 2016-06-06 MED ORDER — MIDAZOLAM HCL 2 MG/2ML IJ SOLN
INTRAMUSCULAR | Status: AC
Start: 2016-06-06 — End: 2016-06-06
  Administered 2016-06-06: 1 mg
  Filled 2016-06-06: qty 6

## 2016-06-06 MED ORDER — ETOMIDATE 2 MG/ML IV SOLN
INTRAVENOUS | Status: AC | PRN
  Administered 2016-06-06: 20 mg via INTRAVENOUS

## 2016-06-06 MED ORDER — SODIUM BICARBONATE 8.4 % IV SOLN
INTRAVENOUS | Status: DC | PRN
  Administered 2016-06-06 (×2): 100 meq via INTRAVENOUS

## 2016-06-06 MED ORDER — PROPOFOL 10 MG/ML IV BOLUS
INTRAVENOUS | Status: AC
  Filled 2016-06-06: qty 20

## 2016-06-06 MED ORDER — PROPOFOL 1000 MG/100ML IV EMUL
0.0000 ug/kg/min | INTRAVENOUS | Status: DC
  Filled 2016-06-06: qty 100

## 2016-06-06 SURGICAL SUPPLY — 49 items
BLADE SURG ROTATE 9660 (MISCELLANEOUS) IMPLANT
CANISTER SUCTION 2500CC (MISCELLANEOUS) ×3 IMPLANT
CATH THORACIC 28FR (CATHETERS) ×4 IMPLANT
CHLORAPREP W/TINT 26ML (MISCELLANEOUS) ×3 IMPLANT
COVER SURGICAL LIGHT HANDLE (MISCELLANEOUS) ×3 IMPLANT
DRAIN CHANNEL 19F RND (DRAIN) ×2 IMPLANT
DRAPE LAPAROSCOPIC ABDOMINAL (DRAPES) ×3 IMPLANT
DRAPE WARM FLUID 44X44 (DRAPE) ×3 IMPLANT
DRSG OPSITE POSTOP 4X10 (GAUZE/BANDAGES/DRESSINGS) IMPLANT
DRSG OPSITE POSTOP 4X8 (GAUZE/BANDAGES/DRESSINGS) IMPLANT
DRSG PAD ABDOMINAL 8X10 ST (GAUZE/BANDAGES/DRESSINGS) ×2 IMPLANT
ELECT BLADE 6.5 EXT (BLADE) IMPLANT
ELECT CAUTERY BLADE 6.4 (BLADE) IMPLANT
ELECT REM PT RETURN 9FT ADLT (ELECTROSURGICAL) ×3
ELECTRODE REM PT RTRN 9FT ADLT (ELECTROSURGICAL) ×1 IMPLANT
EVACUATOR SILICONE 100CC (DRAIN) ×2 IMPLANT
GLOVE BIO SURGEON STRL SZ7 (GLOVE) ×3 IMPLANT
GLOVE BIOGEL PI IND STRL 7.5 (GLOVE) ×1 IMPLANT
GLOVE BIOGEL PI INDICATOR 7.5 (GLOVE) ×2
GOWN STRL REUS W/ TWL LRG LVL3 (GOWN DISPOSABLE) ×2 IMPLANT
GOWN STRL REUS W/TWL LRG LVL3 (GOWN DISPOSABLE) ×6
KIT BASIN OR (CUSTOM PROCEDURE TRAY) ×3 IMPLANT
KIT ROOM TURNOVER OR (KITS) ×3 IMPLANT
NS IRRIG 1000ML POUR BTL (IV SOLUTION) ×6 IMPLANT
PACK GENERAL/GYN (CUSTOM PROCEDURE TRAY) ×3 IMPLANT
PAD ARMBOARD 7.5X6 YLW CONV (MISCELLANEOUS) ×3 IMPLANT
SEALER TISSUE X1 CVD JAW (INSTRUMENTS) IMPLANT
SPECIMEN JAR LARGE (MISCELLANEOUS) IMPLANT
SPONGE GAUZE 4X4 12PLY STER LF (GAUZE/BANDAGES/DRESSINGS) ×2 IMPLANT
SPONGE LAP 18X18 X RAY DECT (DISPOSABLE) IMPLANT
STAPLER VISISTAT 35W (STAPLE) ×3 IMPLANT
SUCTION POOLE TIP (SUCTIONS) ×3 IMPLANT
SUT ETHILON 2 0 FS 18 (SUTURE) ×4 IMPLANT
SUT NOVA NAB GS-21 0 18 T12 DT (SUTURE) ×2 IMPLANT
SUT PDS AB 1 TP1 96 (SUTURE) ×6 IMPLANT
SUT PROLENE 2 0 CT2 30 (SUTURE) ×2 IMPLANT
SUT PROLENE 2 0 SH 30 (SUTURE) ×22 IMPLANT
SUT SILK 2 0 (SUTURE) ×3
SUT SILK 2 0 FS (SUTURE) ×6 IMPLANT
SUT SILK 2 0 SH CR/8 (SUTURE) ×3 IMPLANT
SUT SILK 2-0 18XBRD TIE 12 (SUTURE) ×1 IMPLANT
SUT SILK 3 0 (SUTURE) ×3
SUT SILK 3 0 SH CR/8 (SUTURE) ×3 IMPLANT
SUT SILK 3-0 18XBRD TIE 12 (SUTURE) ×1 IMPLANT
SYSTEM SAHARA CHEST DRAIN RE-I (WOUND CARE) ×4 IMPLANT
TAPE CLOTH SURG 6X10 WHT LF (GAUZE/BANDAGES/DRESSINGS) ×2 IMPLANT
TOWEL OR 17X26 10 PK STRL BLUE (TOWEL DISPOSABLE) ×3 IMPLANT
TRAY FOLEY CATH 16FRSI W/METER (SET/KITS/TRAYS/PACK) ×3 IMPLANT
YANKAUER SUCT BULB TIP NO VENT (SUCTIONS) IMPLANT

## 2016-06-06 NOTE — Progress Notes (Signed)
Subjective:     Patient ID: Kathy Franklin, female   DOB: 03-11-1927, 82 y.o.   MRN: [id number]  HPI patient points to left big toe stating that it has been hurting her since and injury 3 days ago dropped and cast roll on it and she was wanted to be seen by her family physician   Review of Systems     Objective:   Physical Exam Neurovascular status intact with mild ecchymosis the left hallux localized in nature with loss hallux nail secondary to trauma    Assessment:     Traumatized left hallux localized in nature with nail disease    Plan:     H&P x-rays reviewed and advised on soaks therapy Band-Aid usage and this should heal uneventfully. Reappoint if symptoms continue
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
## 2016-06-06 NOTE — Anesthesia Preprocedure Evaluation (Signed)
Anesthesia Evaluation    Reviewed: Unable to perform ROS - Chart review onlyPreop documentation limited or incomplete due to emergent nature of procedure.  Airway Mallampati: Intubated       Dental   Pulmonary           Cardiovascular      Neuro/Psych    GI/Hepatic   Endo/Other    Renal/GU      Musculoskeletal   Abdominal   Peds  Hematology   Anesthesia Other Findings   Reproductive/Obstetrics                             Anesthesia Physical Anesthesia Plan  ASA: IV and emergent  Anesthesia Plan: General   Post-op Pain Management:    Induction: Intravenous and Inhalational  Airway Management Planned:   Additional Equipment:   Intra-op Plan:   Post-operative Plan:   Informed Consent:   Plan Discussed with: Anesthesiologist and Surgeon  Anesthesia Plan Comments:         Anesthesia Quick Evaluation

## 2016-06-06 NOTE — Anesthesia Preprocedure Evaluation (Signed)
Anesthesia Evaluation  Patient identified by MRN, date of birth, ID band Patient unresponsive  General Assessment Comment:Pt intubated.Preop documentation limited or incomplete due to emergent nature of procedure.  Airway Mallampati: Intubated       Dental   Pulmonary     + decreased breath sounds      Cardiovascular  Rhythm:regular Rate:Tachycardia     Neuro/Psych    GI/Hepatic   Endo/Other    Renal/GU      Musculoskeletal   Abdominal   Peds  Hematology   Anesthesia Other Findings   Reproductive/Obstetrics                             Anesthesia Physical Anesthesia Plan  ASA: IV and emergent  Anesthesia Plan: General   Post-op Pain Management:    Induction: Intravenous  Airway Management Planned: Oral ETT  Additional Equipment: Arterial line, CVP and Ultrasound Guidance Line Placement  Intra-op Plan:   Post-operative Plan: Post-operative intubation/ventilation  Informed Consent:   Only emergency history available  Plan Discussed with: CRNA, Anesthesiologist and Surgeon  Anesthesia Plan Comments:         Anesthesia Quick Evaluation
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
## 2016-06-06 NOTE — ED Notes (Signed)
Pharmacy to send bolus

## 2016-06-06 NOTE — Transfer of Care (Signed)
Immediate Anesthesia Transfer of Care Note  Patient: Candance Hucke  Procedure(s) Performed: Procedure(s): EXPLORATORY LAPAROTOMY, BILATERAL CHEST TUBE INSERTION, REPAIR DIAPHRAGM, OPEN GASTROSTOMY (Bilateral)  Patient Location: ICU  Anesthesia Type:General  Level of Consciousness: Patient remains intubated per anesthesia plan  Airway & Oxygen Therapy: Patient remains intubated per anesthesia plan and Patient placed on Ventilator (see vital sign flow sheet for setting)  Post-op Assessment: Report given to RN and Post -op Vital signs reviewed and stable  Post vital signs: Reviewed and stable  Last Vitals:  Vitals:   06/29/2016 2110 06/18/2016 2115  BP: 98/69 110/72  Pulse: 105 107  Resp: 25 23  Temp:      Last Pain:  Vitals:   06/30/2016 1934  TempSrc: Rectal         Complications: No apparent anesthesia complications

## 2016-06-06 NOTE — ED Notes (Signed)
Less than 2 hours since blood transfusion so lactic acid plasma blood cncled

## 2016-06-07 ENCOUNTER — Encounter (HOSPITAL_COMMUNITY): Payer: Self-pay | Admitting: General Surgery

## 2016-06-07 ENCOUNTER — Inpatient Hospital Stay (HOSPITAL_COMMUNITY): Payer: Medicare Other

## 2016-06-07 DIAGNOSIS — S270XXA Traumatic pneumothorax, initial encounter: Secondary | ICD-10-CM

## 2016-06-07 DIAGNOSIS — S225XXA Flail chest, initial encounter for closed fracture: Secondary | ICD-10-CM

## 2016-06-07 LAB — POCT I-STAT 3, ART BLOOD GAS (G3+)
ACID-BASE DEFICIT: 2 mmol/L (ref 0.0–2.0)
Acid-base deficit: 10 mmol/L — ABNORMAL HIGH (ref 0.0–2.0)
BICARBONATE: 16.9 mmol/L — AB (ref 20.0–28.0)
Bicarbonate: 24.4 mmol/L (ref 20.0–28.0)
O2 SAT: 90 %
O2 Saturation: 100 %
PCO2 ART: 35.4 mmHg (ref 32.0–48.0)
PH ART: 7.268 — AB (ref 7.350–7.450)
PO2 ART: 227 mmHg — AB (ref 83.0–108.0)
TCO2: 18 mmol/L (ref 0–100)
TCO2: 26 mmol/L (ref 0–100)
pCO2 arterial: 53.1 mmHg — ABNORMAL HIGH (ref 32.0–48.0)
pH, Arterial: 7.27 — ABNORMAL LOW (ref 7.350–7.450)
pO2, Arterial: 66 mmHg — ABNORMAL LOW (ref 83.0–108.0)

## 2016-06-07 LAB — CBC
HCT: 20.4 % — ABNORMAL LOW (ref 36.0–46.0)
HEMATOCRIT: 15.8 % — AB (ref 36.0–46.0)
HEMOGLOBIN: 5.4 g/dL — AB (ref 12.0–15.0)
Hemoglobin: 7 g/dL — ABNORMAL LOW (ref 12.0–15.0)
MCH: 29 pg (ref 26.0–34.0)
MCH: 29.2 pg (ref 26.0–34.0)
MCHC: 34.3 g/dL (ref 30.0–36.0)
MCHC: 34.2 g/dL (ref 30.0–36.0)
MCV: 84.6 fL (ref 78.0–100.0)
MCV: 85.4 fL (ref 78.0–100.0)
PLATELETS: 35 10*3/uL — AB (ref 150–400)
Platelets: 56 10*3/uL — ABNORMAL LOW (ref 150–400)
RBC: 2.41 MIL/uL — ABNORMAL LOW (ref 3.87–5.11)
RBC: 1.85 MIL/uL — ABNORMAL LOW (ref 3.87–5.11)
RDW: 14.1 % (ref 11.5–15.5)
RDW: 14.1 % (ref 11.5–15.5)
WBC: 6.9 10*3/uL (ref 4.0–10.5)
WBC: 4.1 10*3/uL (ref 4.0–10.5)

## 2016-06-07 LAB — BASIC METABOLIC PANEL
ANION GAP: 7 (ref 5–15)
BUN: 22 mg/dL — ABNORMAL HIGH (ref 6–20)
CALCIUM: 7.8 mg/dL — AB (ref 8.9–10.3)
CO2: 21 mmol/L — ABNORMAL LOW (ref 22–32)
CREATININE: 0.99 mg/dL (ref 0.44–1.00)
Chloride: 116 mmol/L — ABNORMAL HIGH (ref 101–111)
GFR, EST AFRICAN AMERICAN: 58 mL/min — AB (ref >60)
GFR, EST NON AFRICAN AMERICAN: 50 mL/min — AB (ref >60)
Glucose, Bld: 213 mg/dL — ABNORMAL HIGH (ref 65–99)
Potassium: 3.7 mmol/L (ref 3.5–5.1)
Sodium: 144 mmol/L (ref 135–145)

## 2016-06-07 LAB — POCT I-STAT, CHEM 8
BUN: 24 mg/dL — AB (ref 6–20)
CALCIUM ION: 1.14 mmol/L — AB (ref 1.15–1.40)
Chloride: 108 mmol/L (ref 101–111)
Creatinine, Ser: 0.7 mg/dL (ref 0.44–1.00)
GLUCOSE: 211 mg/dL — AB (ref 65–99)
HCT: 23 % — ABNORMAL LOW (ref 36.0–46.0)
Hemoglobin: 7.8 g/dL — ABNORMAL LOW (ref 12.0–15.0)
Potassium: 4.1 mmol/L (ref 3.5–5.1)
SODIUM: 146 mmol/L — AB (ref 135–145)
TCO2: 24 mmol/L (ref 0–100)

## 2016-06-07 LAB — DIC (DISSEMINATED INTRAVASCULAR COAGULATION)PANEL
D-Dimer, Quant: >20 ug/mL-FEU — ABNORMAL HIGH (ref 0.00–0.50)
INR: 1.87
Platelets: 36 10*3/uL — ABNORMAL LOW (ref 150–400)
Prothrombin Time: 21.8 seconds — ABNORMAL HIGH (ref 11.4–15.2)
aPTT: 60 seconds — ABNORMAL HIGH (ref 24–36)

## 2016-06-07 LAB — BLOOD GAS, ARTERIAL
ACID-BASE DEFICIT: 10.6 mmol/L — AB (ref 0.0–2.0)
BICARBONATE: 15.2 mmol/L — AB (ref 20.0–28.0)
DRAWN BY: 398991
FIO2: 70
O2 SAT: 99.1 %
PATIENT TEMPERATURE: 97.2
PCO2 ART: 34.1 mmHg (ref 32.0–48.0)
PEEP: 5 cmH2O
PH ART: 7.265 — AB (ref 7.350–7.450)
PO2 ART: 165 mmHg — AB (ref 83.0–108.0)
RATE: 18 resp/min

## 2016-06-07 LAB — PREPARE PLATELET PHERESIS: Unit division: 0

## 2016-06-07 LAB — PREPARE RBC (CROSSMATCH)

## 2016-06-07 LAB — LACTIC ACID, PLASMA
LACTIC ACID, VENOUS: 6.1 mmol/L — AB (ref 0.5–1.9)
Lactic Acid, Venous: 9.8 mmol/L (ref 0.5–1.9)

## 2016-06-07 LAB — GLUCOSE, CAPILLARY: Glucose-Capillary: 134 mg/dL — ABNORMAL HIGH (ref 65–99)

## 2016-06-07 LAB — PROTIME-INR
INR: 1.91
INR: 0.94
PROTHROMBIN TIME: 12.6 s (ref 11.4–15.2)
Prothrombin Time: 22.1 seconds — ABNORMAL HIGH (ref 11.4–15.2)

## 2016-06-07 LAB — DIC (DISSEMINATED INTRAVASCULAR COAGULATION) PANEL
FIBRINOGEN: 106 mg/dL — AB (ref 210–475)
SMEAR REVIEW: NONE SEEN

## 2016-06-07 LAB — BLOOD PRODUCT ORDER (VERBAL) VERIFICATION

## 2016-06-07 LAB — CDS SEROLOGY

## 2016-06-07 LAB — MRSA PCR SCREENING: MRSA by PCR: NEGATIVE

## 2016-06-07 LAB — TRIGLYCERIDES: TRIGLYCERIDES: 72 mg/dL (ref <150)

## 2016-06-07 MED ORDER — DEXTROSE-NACL 5-0.45 % IV SOLN
INTRAVENOUS | Status: DC
  Administered 2016-06-07: 08:00:00 via INTRAVENOUS
  Filled 2016-06-07 (×3): qty 1000

## 2016-06-07 MED ORDER — SODIUM CHLORIDE 0.9 % IV SOLN
1.0000 g | Freq: Once | INTRAVENOUS | Status: AC
  Administered 2016-06-07: 1 g via INTRAVENOUS
  Filled 2016-06-07: qty 10

## 2016-06-07 MED ORDER — SODIUM CHLORIDE 0.9 % IV SOLN: Freq: Once | INTRAVENOUS | Status: DC

## 2016-06-07 MED ORDER — ALBUMIN HUMAN 5 % IV SOLN
INTRAVENOUS | Status: AC
  Filled 2016-06-07: qty 250

## 2016-06-07 MED ORDER — COAGULATION FACTOR VIIA RECOMB 1 MG IV SOLR
90.0000 ug/kg | Freq: Once | INTRAVENOUS | Status: AC
  Administered 2016-06-07: 5000 ug via INTRAVENOUS
  Filled 2016-06-07: qty 5

## 2016-06-07 MED ORDER — DEXTROSE 5 % IV SOLN
0.0000 ug/min | INTRAVENOUS | Status: DC
  Administered 2016-06-07: 36 ug/min via INTRAVENOUS
  Filled 2016-06-07 (×2): qty 16

## 2016-06-07 MED ORDER — ALBUMIN HUMAN 5 % IV SOLN
12.5000 g | Freq: Once | INTRAVENOUS | Status: AC
  Administered 2016-06-07: 12.5 g via INTRAVENOUS

## 2016-06-07 MED ORDER — SODIUM CHLORIDE 0.9 % IV SOLN
10.0000 mg/h | INTRAVENOUS | Status: DC
  Filled 2016-06-07: qty 5
  Filled 2016-06-07: qty 10

## 2016-06-08 ENCOUNTER — Encounter: Payer: Self-pay | Admitting: Podiatry

## 2016-06-08 LAB — TYPE AND SCREEN
ABO/RH(D): A POS
Antibody Screen: NEGATIVE
UNIT DIVISION: 0
UNIT DIVISION: 0
UNIT DIVISION: 0
UNIT DIVISION: 0
UNIT DIVISION: 0
UNIT DIVISION: 0
UNIT DIVISION: 0
UNIT DIVISION: 0
UNIT DIVISION: 0
UNIT DIVISION: 0
UNIT DIVISION: 0
UNIT DIVISION: 0
UNIT DIVISION: 0
Unit division: 0
Unit division: 0
Unit division: 0
Unit division: 0
Unit division: 0
Unit division: 0
Unit division: 0
Unit division: 0
Unit division: 0
Unit division: 0
Unit division: 0
Unit division: 0
Unit division: 0
Unit division: 0

## 2016-06-08 LAB — PREPARE FRESH FROZEN PLASMA
UNIT DIVISION: 0
UNIT DIVISION: 0
UNIT DIVISION: 0
UNIT DIVISION: 0
UNIT DIVISION: 0
UNIT DIVISION: 0
UNIT DIVISION: 0
UNIT DIVISION: 0
UNIT DIVISION: 0
UNIT DIVISION: 0
UNIT DIVISION: 0
UNIT DIVISION: 0
UNIT DIVISION: 0
UNIT DIVISION: 0
Unit division: 0
Unit division: 0
Unit division: 0
Unit division: 0
Unit division: 0
Unit division: 0
Unit division: 0
Unit division: 0

## 2016-06-08 LAB — PREPARE CRYOPRECIPITATE: Unit division: 0

## 2016-06-08 LAB — PREPARE PLATELET PHERESIS: UNIT DIVISION: 0

## 2016-07-01 DIAGNOSIS — R571 Hypovolemic shock: Secondary | ICD-10-CM | POA: Diagnosis present

## 2016-07-01 DIAGNOSIS — S62501A Fracture of unspecified phalanx of right thumb, initial encounter for closed fracture: Secondary | ICD-10-CM | POA: Diagnosis present

## 2016-07-01 DIAGNOSIS — D62 Acute posthemorrhagic anemia: Secondary | ICD-10-CM | POA: Diagnosis not present

## 2016-07-01 DIAGNOSIS — J9600 Acute respiratory failure, unspecified whether with hypoxia or hypercapnia: Secondary | ICD-10-CM | POA: Diagnosis present

## 2016-07-01 DIAGNOSIS — S27809A Unspecified injury of diaphragm, initial encounter: Secondary | ICD-10-CM | POA: Diagnosis present

## 2016-07-01 DIAGNOSIS — S32401A Unspecified fracture of right acetabulum, initial encounter for closed fracture: Secondary | ICD-10-CM | POA: Diagnosis present

## 2016-07-01 DIAGNOSIS — S2241XA Multiple fractures of ribs, right side, initial encounter for closed fracture: Secondary | ICD-10-CM | POA: Diagnosis present

## 2016-07-01 DIAGNOSIS — S32009A Unspecified fracture of unspecified lumbar vertebra, initial encounter for closed fracture: Secondary | ICD-10-CM | POA: Diagnosis present

## 2016-07-01 DIAGNOSIS — S32029A Unspecified fracture of second lumbar vertebra, initial encounter for closed fracture: Secondary | ICD-10-CM | POA: Diagnosis present

## 2016-07-01 NOTE — Progress Notes (Signed)
Called [facility] to make a referral, Reference # [id number], per [facility] pt is not a candidate, call for time of death

## 2016-07-01 NOTE — Anesthesia Postprocedure Evaluation (Signed)
Anesthesia Post Note  Patient: Kathy Franklin  Procedure(s) Performed: Procedure(s) (LRB): EXPLORATORY LAPAROTOMY, BILATERAL CHEST TUBE INSERTION, REPAIR DIAPHRAGM, OPEN GASTROSTOMY (Bilateral)  Patient location during evaluation: ICU Anesthesia Type: General Level of consciousness: sedated Pain management: pain level controlled Vital Signs Assessment: post-procedure vital signs reviewed and stable Respiratory status: patient on ventilator - see flowsheet for VS and patient remains intubated per anesthesia plan Cardiovascular status: stable Anesthetic complications: no    Last Vitals:  Vitals:   2016/06/24 0100 06/24/16 0115  BP: (!) 88/56   Pulse: (!) 102 (!) 102  Resp: (!) 0 (!) 0  Temp:      Last Pain:  Vitals:   06/19/2016 1934  TempSrc: Rectal                 Coy Rochford S

## 2016-07-01 NOTE — Progress Notes (Signed)
Patient ID: Kiasha Melander, female   DOB: 03/27/1927, 82 y.o.   MRN: [id number] Appreciate TCTS input and I spoke with Dr. Roxy Manns. DNR, stop Levophed and stop blood products. Family at the bedside.  Georganna Skeans, MD, MPH, FACS Trauma: [phone] General Surgery: [phone]
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
## 2016-07-01 NOTE — Procedures (Addendum)
Central Venous Catheter Insertion Procedure Note Chanchal Niehues [id number] [birth year] Date of Procedure: 06/26/2016 Procedure: Insertion of Central Venous Catheter Indications: access due to hypotension  Procedure Details Consent: Unable to obtain consent because of emergent medical necessity.  Maximum sterile technique was used including antiseptics. Skin prep: Iodine solution; A antimicrobial bonded/coated triple lumen catheter was placed in the right subclavian vein using the Seldinger technique.  Evaluation Blood flow good Complications: No apparent complications Patient did tolerate procedure well. Chest X-ray ordered to verify placement.  [id number] in position.  Orey Moure 07-07-16, 1:10 AM
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
## 2016-07-01 NOTE — Progress Notes (Signed)
Patient ID: Kathy Franklin, female   DOB: 18-Dec-1926, 82 y.o.   MRN: [id number] Time of death 17. Family at bedside.  Georganna Skeans, MD, MPH, FACS Trauma: [phone] General Surgery: [phone]

## 2016-07-01 NOTE — Progress Notes (Signed)
Patient disconnected from ventilator per Dr. Grandville Silos at 11:25.  ETT to still remain.

## 2016-07-01 DEATH — deceased
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
## 2016-09-20 ENCOUNTER — Ambulatory Visit: Payer: Medicare Other | Admitting: Nurse Practitioner
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
